# Patient Record
Sex: Female | Born: 1990 | Race: White | Hispanic: No | Marital: Married | Smoking: Never smoker
Health system: Southern US, Community
[De-identification: ages and names within clinical notes are randomized; demographics above are authoritative.]

## PROBLEM LIST (undated history)

## (undated) ENCOUNTER — Inpatient Hospital Stay (HOSPITAL_COMMUNITY): Payer: Self-pay

## (undated) DIAGNOSIS — Z9889 Other specified postprocedural states: Secondary | ICD-10-CM

## (undated) DIAGNOSIS — E119 Type 2 diabetes mellitus without complications: Secondary | ICD-10-CM

## (undated) DIAGNOSIS — R112 Nausea with vomiting, unspecified: Secondary | ICD-10-CM

## (undated) DIAGNOSIS — R7303 Prediabetes: Secondary | ICD-10-CM

## (undated) DIAGNOSIS — G5600 Carpal tunnel syndrome, unspecified upper limb: Secondary | ICD-10-CM

## (undated) DIAGNOSIS — O99350 Diseases of the nervous system complicating pregnancy, unspecified trimester: Secondary | ICD-10-CM

## (undated) DIAGNOSIS — K219 Gastro-esophageal reflux disease without esophagitis: Secondary | ICD-10-CM

## (undated) HISTORY — PX: WISDOM TOOTH EXTRACTION: SHX21

---
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
## 2010-02-23 ENCOUNTER — Inpatient Hospital Stay (HOSPITAL_COMMUNITY): Admission: RE | Admit: 2010-02-23 | Discharge: 2010-02-26 | Payer: Self-pay | Admitting: Obstetrics and Gynecology

## 2010-02-27 ENCOUNTER — Inpatient Hospital Stay (HOSPITAL_COMMUNITY): Admission: AD | Admit: 2010-02-27 | Discharge: 2010-02-28 | Payer: Self-pay | Admitting: Obstetrics and Gynecology

## 2010-08-06 ENCOUNTER — Inpatient Hospital Stay (HOSPITAL_COMMUNITY): Admission: EM | Admit: 2010-08-06 | Discharge: 2010-08-08 | Payer: Self-pay | Admitting: Emergency Medicine

## 2010-08-06 ENCOUNTER — Encounter: Payer: Self-pay | Admitting: Gastroenterology

## 2010-08-07 ENCOUNTER — Ambulatory Visit: Payer: Self-pay | Admitting: Gastroenterology

## 2010-08-07 ENCOUNTER — Encounter: Payer: Self-pay | Admitting: Internal Medicine

## 2011-01-22 NOTE — Procedures (Signed)
Summary: ERCP  Patient: Whitney Wilson Note: All result statuses are Final unless otherwise noted.  Tests: (1) ERCP (ERC)   ERC ERCP                  DONE     [facility]     [address] [place], [place]  [address]           ERCP PROCEDURE REPORT           PATIENT:  Whitney Wilson, Whitney Wilson  MR#:  [id number]     BIRTHDATE:  27-Jan-1991  GENDER:  female           ENDOSCOPIST:  Barbette Hair. Arlyce Dice, MD     ASSISTANT:           PROCEDURE DATE:  08/06/2010     PROCEDURE:  ERCP           INDICATIONS:  suspected stone           MEDICATIONS:   Fentanyl 75 mcg, Versed 7.5 mg IV, Benadryl 50 mg,     glycopyrrolate (Robinal) 0.2 mg     TOPICAL ANESTHETIC:  Cetacaine Spray           DESCRIPTION OF PROCEDURE:   After the risks benefits and     alternatives of the procedure were thoroughly explained, informed     consent was obtained.  The [id number] ([id number]) endoscope was     introduced through the mouth.  The pt was uncooperative and I was     unable to pass the scope into the esophagus despite multiple     attempts.  Procedure was abandoned.           COMPLICATIONS:  None           RECOMMENDATIONS:ERCP with MAC           ______________________________     Barbette Hair. Arlyce Dice, MD           CC:           n.     eSIGNED:   Barbette Hair. Kaplan at 08/06/2010 12:07 PM           Jonette Pesa, [id number]  Note: An exclamation mark (!) indicates a result that was not dispersed into the flowsheet. Document Creation Date: 08/06/2010 12:26 PM _______________________________________________________________________  (1) Order result status: Final Collection or observation date-time: 08/06/2010 12:04 Requested date-time:  Receipt date-time:  Reported date-time:  Referring Physician:   Ordering Physician: Melvia Heaps [phone]) Specimen Source:  Source: Launa Grill Order Number: [id number] Lab site:

## 2011-01-22 NOTE — Procedures (Signed)
Summary: ERCP  Patient: Whitney Wilson Note: All result statuses are Final unless otherwise noted.  Tests: (1) ERCP (ERC)   ERC ERCP                  DONE     [facility]     [address] [place], [place]  [address]           ERCP PROCEDURE REPORT           PATIENT:  Whitney, Wilson  MR#:  [id number]     BIRTHDATE:  Dec 25, 1990  GENDER:  female           ENDOSCOPIST:  Barbette Hair. Arlyce Dice, MD     ASSISTANT:           PROCEDURE DATE:  08/06/2010     PROCEDURE:  ERCP with removal of stones           INDICATIONS:  suspected stone           MEDICATIONS:   MAC sedation, administered by CRNA     TOPICAL ANESTHETIC:           DESCRIPTION OF PROCEDURE:   After the risks benefits and     alternatives of the procedure were thoroughly explained, informed     consent was obtained.  The  endoscope was introduced through the     mouth and advanced to the third portion of the duodenum.           The pancreatic duct was filled to the tail and appeared to be     normal. Care was taken not to overfill the ductal system.     Pancreatic duct cannulated with a 0.85mm wire. Partial injection     demonstrated a normal duct.  A stone was found in the common bile     duct. 6mm CBD stone demonstrated after direct cannulation of a     0.37mm wire followed by contrast injection. 12-29mm sphincterotomy     was made. Stone was extracted using a 15mm balloon stone     extractor.    The scope was then completely withdrawn from the     patient and the procedure terminated.           COMPLICATIONS:  None           ENDOSCOPIC IMPRESSION:     1) Normal pancreatic duct     2) Stone in the common bile duct - s/p sphincterotomy and stone     extraction     RECOMMENDATIONS:     1) cholecystectomy           ______________________________     Barbette Hair. Arlyce Dice, MD           CC:  Iva Boop, M.D., [facility]     Avel Peace, M.D.           n.     eSIGNED:   Barbette Hair. Kaplan at 08/06/2010  04:22 PM           Jonette Pesa, [phone]  Note: An exclamation mark (!) indicates a result that was not dispersed into the flowsheet. Document Creation Date: 08/06/2010 4:24 PM _______________________________________________________________________  (1) Order result status: Final Collection or observation date-time: 08/06/2010 16:17 Requested date-time:  Receipt date-time:  Reported date-time:  Referring Physician:   Ordering Physician: Melvia Heaps [phone]) Specimen Source:  Source: Launa Grill Order Number: [id number] Lab site:

## 2011-01-22 NOTE — Consult Note (Signed)
Summary: [place]  [place]   Imported By: Lester [place] 09/03/2010 11:02:15  _____________________________________________________________________  External Attachment:    Type:   Image     Comment:   External Document

## 2011-03-08 LAB — DIFFERENTIAL
Basophils Absolute: 0 10*3/uL (ref 0.0–0.1)
Basophils Relative: 0 % (ref 0–1)
Lymphocytes Relative: 15 % (ref 12–46)
Neutro Abs: 7.6 10*3/uL (ref 1.7–7.7)
Neutrophils Relative %: 78 % — ABNORMAL HIGH (ref 43–77)

## 2011-03-08 LAB — URINALYSIS, ROUTINE W REFLEX MICROSCOPIC
Nitrite: NEGATIVE
Specific Gravity, Urine: 1.033 — ABNORMAL HIGH (ref 1.005–1.030)
Urobilinogen, UA: 1 mg/dL (ref 0.0–1.0)
pH: 6 (ref 5.0–8.0)

## 2011-03-08 LAB — CBC
HCT: 37.9 % (ref 36.0–46.0)
MCH: 26.4 pg (ref 26.0–34.0)
MCV: 79.8 fL (ref 78.0–100.0)
Platelets: 214 10*3/uL (ref 150–400)
Platelets: 282 10*3/uL (ref 150–400)
RBC: 4.19 MIL/uL (ref 3.87–5.11)
RBC: 4.76 MIL/uL (ref 3.87–5.11)
RDW: 17.1 % — ABNORMAL HIGH (ref 11.5–15.5)
WBC: 4.5 10*3/uL (ref 4.0–10.5)

## 2011-03-08 LAB — COMPREHENSIVE METABOLIC PANEL
AST: 249 U/L — ABNORMAL HIGH (ref 0–37)
Albumin: 3.6 g/dL (ref 3.5–5.2)
Albumin: 4.2 g/dL (ref 3.5–5.2)
Alkaline Phosphatase: 107 U/L (ref 39–117)
Alkaline Phosphatase: 85 U/L (ref 39–117)
BUN: 10 mg/dL (ref 6–23)
BUN: 3 mg/dL — ABNORMAL LOW (ref 6–23)
CO2: 25 mEq/L (ref 19–32)
Chloride: 108 mEq/L (ref 96–112)
Chloride: 110 mEq/L (ref 96–112)
Creatinine, Ser: 0.7 mg/dL (ref 0.4–1.2)
GFR calc Af Amer: >60 mL/min (ref >60)
GFR calc non Af Amer: >60 mL/min (ref >60)
Glucose, Bld: 123 mg/dL — ABNORMAL HIGH (ref 70–99)
Potassium: 4.2 mEq/L (ref 3.5–5.1)
Total Bilirubin: 0.7 mg/dL (ref 0.3–1.2)
Total Bilirubin: 2.6 mg/dL — ABNORMAL HIGH (ref 0.3–1.2)
Total Protein: 6.1 g/dL (ref 6.0–8.3)

## 2011-03-08 LAB — AMYLASE: Amylase: 57 U/L (ref 0–105)

## 2011-03-08 LAB — LIPASE, BLOOD: Lipase: 37 U/L (ref 11–59)

## 2011-03-08 LAB — URINE MICROSCOPIC-ADD ON

## 2011-03-18 LAB — DIFFERENTIAL
Basophils Absolute: 0 10*3/uL (ref 0.0–0.1)
Basophils Absolute: 0 10*3/uL (ref 0.0–0.1)
Basophils Relative: 0 % (ref 0–1)
Lymphocytes Relative: 11 % — ABNORMAL LOW (ref 12–46)
Lymphocytes Relative: 18 % (ref 12–46)
Lymphs Abs: 1.8 10*3/uL (ref 0.7–4.0)
Monocytes Absolute: 0.6 10*3/uL (ref 0.1–1.0)
Monocytes Relative: 6 % (ref 3–12)
Neutro Abs: 10.9 10*3/uL — ABNORMAL HIGH (ref 1.7–7.7)
Neutro Abs: 7.7 10*3/uL (ref 1.7–7.7)
Neutrophils Relative %: 83 % — ABNORMAL HIGH (ref 43–77)

## 2011-03-18 LAB — CBC
HCT: 25.5 % — ABNORMAL LOW (ref 36.0–46.0)
HCT: 26.5 % — ABNORMAL LOW (ref 36.0–46.0)
HCT: 32.9 % — ABNORMAL LOW (ref 36.0–46.0)
Hemoglobin: 8.4 g/dL — ABNORMAL LOW (ref 12.0–15.0)
Hemoglobin: 8.7 g/dL — ABNORMAL LOW (ref 12.0–15.0)
MCHC: 32.4 g/dL (ref 30.0–36.0)
MCHC: 32.9 g/dL (ref 30.0–36.0)
MCHC: 33.1 g/dL (ref 30.0–36.0)
MCV: 78.8 fL (ref 78.0–100.0)
Platelets: 175 10*3/uL (ref 150–400)
Platelets: 240 10*3/uL (ref 150–400)
RBC: 3.4 MIL/uL — ABNORMAL LOW (ref 3.87–5.11)
RBC: 4.17 MIL/uL (ref 3.87–5.11)
RDW: 15.4 % (ref 11.5–15.5)
RDW: 15.6 % — ABNORMAL HIGH (ref 11.5–15.5)
RDW: 15.8 % — ABNORMAL HIGH (ref 11.5–15.5)
WBC: 10.3 10*3/uL (ref 4.0–10.5)
WBC: 8.4 10*3/uL (ref 4.0–10.5)

## 2011-03-18 LAB — RPR: RPR Ser Ql: NONREACTIVE

## 2011-03-29 LAB — URINALYSIS, ROUTINE W REFLEX MICROSCOPIC
Glucose, UA: NEGATIVE mg/dL
Nitrite: NEGATIVE
Specific Gravity, Urine: 1.025 (ref 1.005–1.030)
pH: 6 (ref 5.0–8.0)

## 2011-03-30 LAB — URINALYSIS, ROUTINE W REFLEX MICROSCOPIC
Hgb urine dipstick: NEGATIVE
Nitrite: NEGATIVE
Specific Gravity, Urine: 1.025 (ref 1.005–1.030)
Urobilinogen, UA: 0.2 mg/dL (ref 0.0–1.0)
pH: 6.5 (ref 5.0–8.0)

## 2011-03-30 LAB — CBC
Hemoglobin: 12.4 g/dL (ref 12.0–15.0)
MCV: 90.1 fL (ref 78.0–100.0)
RBC: 4.05 MIL/uL (ref 3.87–5.11)
WBC: 10.4 10*3/uL (ref 4.0–10.5)

## 2011-04-03 LAB — GC/CHLAMYDIA PROBE AMP, GENITAL
Chlamydia, DNA Probe: NEGATIVE
GC Probe Amp, Genital: NEGATIVE

## 2011-04-03 LAB — WET PREP, GENITAL
Clue Cells Wet Prep HPF POC: NONE SEEN
Trich, Wet Prep: NONE SEEN

## 2011-11-10 ENCOUNTER — Encounter: Payer: Self-pay | Admitting: *Deleted

## 2011-11-10 ENCOUNTER — Emergency Department (HOSPITAL_COMMUNITY): Payer: Medicaid Other

## 2011-11-10 ENCOUNTER — Inpatient Hospital Stay (HOSPITAL_COMMUNITY)
Admission: EM | Admit: 2011-11-10 | Discharge: 2011-11-14 | DRG: 603 | Disposition: A | Discharge status: Home / Self Care | Payer: Medicaid Other | Attending: Internal Medicine | Admitting: Internal Medicine

## 2011-11-10 DIAGNOSIS — Z79899 Other long term (current) drug therapy: Secondary | ICD-10-CM

## 2011-11-10 DIAGNOSIS — K5900 Constipation, unspecified: Secondary | ICD-10-CM | POA: Diagnosis not present

## 2011-11-10 DIAGNOSIS — K029 Dental caries, unspecified: Secondary | ICD-10-CM | POA: Diagnosis present

## 2011-11-10 DIAGNOSIS — L0201 Cutaneous abscess of face: Principal | ICD-10-CM | POA: Diagnosis present

## 2011-11-10 DIAGNOSIS — L03211 Cellulitis of face: Principal | ICD-10-CM | POA: Diagnosis present

## 2011-11-10 DIAGNOSIS — K0889 Other specified disorders of teeth and supporting structures: Secondary | ICD-10-CM

## 2011-11-10 LAB — CBC
HCT: 38.5 % (ref 36.0–46.0)
HCT: 37 % (ref 36.0–46.0)
Hemoglobin: 12.6 g/dL (ref 12.0–15.0)
MCH: 29.1 pg (ref 26.0–34.0)
MCV: 86.3 fL (ref 78.0–100.0)
MCV: 86.4 fL (ref 78.0–100.0)
RBC: 4.46 MIL/uL (ref 3.87–5.11)
RBC: 4.28 MIL/uL (ref 3.87–5.11)
RDW: 12.4 % (ref 11.5–15.5)
WBC: 10.5 10*3/uL (ref 4.0–10.5)
WBC: 10 10*3/uL (ref 4.0–10.5)

## 2011-11-10 LAB — CREATININE, SERUM: GFR calc Af Amer: >90 mL/min (ref >90)

## 2011-11-10 LAB — BASIC METABOLIC PANEL
BUN: 6 mg/dL (ref 6–23)
CO2: 25 mEq/L (ref 19–32)
Chloride: 102 mEq/L (ref 96–112)
GFR calc Af Amer: >90 mL/min (ref >90)
GFR calc non Af Amer: >90 mL/min (ref >90)

## 2011-11-10 MED ORDER — HEPARIN SODIUM (PORCINE) 5000 UNIT/ML IJ SOLN
5000.0000 [IU] | Freq: Three times a day (TID) | INTRAMUSCULAR | Status: DC
  Administered 2011-11-13: 5000 [IU] via SUBCUTANEOUS
  Filled 2011-11-10 (×14): qty 1

## 2011-11-10 MED ORDER — SODIUM CHLORIDE 0.9 % IV SOLN
INTRAVENOUS | Status: DC
  Administered 2011-11-10 – 2011-11-12 (×3): via INTRAVENOUS

## 2011-11-10 MED ORDER — NORETHINDRONE ACET-ETHINYL EST 1.5-30 MG-MCG PO TABS
1.0000 | ORAL_TABLET | Freq: Every day | ORAL | Status: DC
  Filled 2011-11-10: qty 1

## 2011-11-10 MED ORDER — HYDROCODONE-ACETAMINOPHEN 5-325 MG PO TABS
1.5000 | ORAL_TABLET | ORAL | Status: DC | PRN
  Administered 2011-11-10 – 2011-11-14 (×15): 1.5 via ORAL
  Filled 2011-11-10 (×15): qty 2

## 2011-11-10 MED ORDER — HYDROMORPHONE HCL PF 1 MG/ML IJ SOLN
1.0000 mg | Freq: Once | INTRAMUSCULAR | Status: AC
  Administered 2011-11-10: 1 mg via INTRAVENOUS
  Filled 2011-11-10: qty 1

## 2011-11-10 MED ORDER — CLINDAMYCIN PHOSPHATE 900 MG/50ML IV SOLN
900.0000 mg | Freq: Three times a day (TID) | INTRAVENOUS | Status: DC
  Administered 2011-11-10 – 2011-11-14 (×11): 900 mg via INTRAVENOUS
  Filled 2011-11-10 (×12): qty 50

## 2011-11-10 MED ORDER — ONDANSETRON HCL 4 MG/2ML IJ SOLN: 4.0000 mg | Freq: Three times a day (TID) | INTRAMUSCULAR | Status: DC | PRN

## 2011-11-10 MED ORDER — HYDROMORPHONE HCL PF 1 MG/ML IJ SOLN
1.0000 mg | INTRAMUSCULAR | Status: DC | PRN
  Administered 2011-11-10 – 2011-11-13 (×11): 1 mg via INTRAVENOUS
  Filled 2011-11-10 (×12): qty 1

## 2011-11-10 MED ORDER — CHLORHEXIDINE GLUCONATE 0.12 % MT SOLN
15.0000 mL | Freq: Four times a day (QID) | OROMUCOSAL | Status: DC
  Administered 2011-11-12 – 2011-11-13 (×6): 15 mL via OROMUCOSAL
  Filled 2011-11-10 (×17): qty 15

## 2011-11-10 MED ORDER — HYDROMORPHONE HCL PF 1 MG/ML IJ SOLN: 1.0000 mg | INTRAMUSCULAR | Status: DC | PRN

## 2011-11-10 MED ORDER — CLINDAMYCIN PHOSPHATE 900 MG/50ML IV SOLN
900.0000 mg | Freq: Three times a day (TID) | INTRAVENOUS | Status: DC
  Administered 2011-11-10: 900 mg via INTRAVENOUS
  Filled 2011-11-10 (×3): qty 50

## 2011-11-10 MED ORDER — ONDANSETRON HCL 4 MG/2ML IJ SOLN
4.0000 mg | Freq: Once | INTRAMUSCULAR | Status: AC
  Administered 2011-11-10: 4 mg via INTRAVENOUS
  Filled 2011-11-10: qty 2

## 2011-11-10 MED ORDER — IOHEXOL 300 MG/ML  SOLN
80.0000 mL | Freq: Once | INTRAMUSCULAR | Status: AC | PRN
  Administered 2011-11-10: 80 mL via INTRAVENOUS

## 2011-11-10 NOTE — ED Notes (Signed)
Attempted to call report to [phone], RN is passing meds and will call back for report

## 2011-11-10 NOTE — H&P (Addendum)
Whitney Wilson is an 20 y.o. female.   Chief Complaint:  fascial swelling and pain for 3 days HPI: This is 20 year old female without previous significant medical history, presented to the ED after has 5 teeth extraction on Thursday byDr. Barbette Merino.  Presented withr worsening pain and swelling yesterday and was started on by mouth clindamycin via her dentist. Today she reports worsening pain decreased intake and worsening swelling of the left side of her face. She's taken 3 oral clindamycin without improvement.presented to the ER for further evaluation. She denies fever or chills. She reports mild redness of the left side of her face. . Her symptoms are constant and worsening.   PMH. No pertinent past medical history.  Past Surgical History  Procedure Date  . Wisdom tooth extraction   . Cesarean section   cholecystectomy  Family history: no stroke, history of diabetes on her mom side Social History:  reports that she has never smoked.  She reports that she does not drink alcohol or use illicit drugs.she  Has son  Allergies:  Allergies  Allergen Reactions  . Morphine And Related Hives  . Unasyn (Ampicillin-Sulbactam Sodium) Hives    Medications Prior to Admission  Medication Dose Route Frequency Provider Last Rate Last Dose  . clindamycin (CLEOCIN) IVPB 900 mg  900 mg Intravenous Q8H Lyanne Co, MD   900 mg at 11/10/11 1440  . HYDROmorphone (DILAUDID) injection 1 mg  1 mg Intravenous Once Lyanne Co, MD   1 mg at 11/10/11 1537  . iohexol (OMNIPAQUE) 300 MG/ML injection 80 mL  80 mL Intravenous Once PRN Lyanne Co, MD   80 mL at 11/10/11 1542  . ondansetron (ZOFRAN) injection 4 mg  4 mg Intravenous Once Lyanne Co, MD   4 mg at 11/10/11 1538   No current outpatient prescriptions on file as of 11/10/2011.    Results for orders placed during the hospital encounter of 11/10/11 (from the past 48 hour(s))  CBC     Status: Normal   Collection Time   11/10/11  1:57 PM     Component Value Range Comment   WBC 10.5  4.0 - 10.5 (K/uL)    RBC 4.46  3.87 - 5.11 (MIL/uL)    Hemoglobin 13.0  12.0 - 15.0 (g/dL)    HCT 47.8  29.5 - 62.1 (%)    MCV 86.3  78.0 - 100.0 (fL)    MCH 29.1  26.0 - 34.0 (pg)    MCHC 33.8  30.0 - 36.0 (g/dL)    RDW 30.8  65.7 - 84.6 (%)    Platelets 234  150 - 400 (K/uL)   BASIC METABOLIC PANEL     Status: Normal   Collection Time   11/10/11  1:57 PM      Component Value Range Comment   Sodium 138  135 - 145 (mEq/L)    Potassium 3.6  3.5 - 5.1 (mEq/L)    Chloride 102  96 - 112 (mEq/L)    CO2 25  19 - 32 (mEq/L)    Glucose, Bld 99  70 - 99 (mg/dL)    BUN 6  6 - 23 (mg/dL)    Creatinine, Ser 9.62  0.50 - 1.10 (mg/dL)    Calcium 9.6  8.4 - 10.5 (mg/dL)    GFR calc non Af Amer >90  >90 (mL/min)    GFR calc Af Amer >90  >90 (mL/min)    Ct Maxillofacial W/cm  11/10/2011  *RADIOLOGY  REPORT*  Clinical Data: Left facial pain and swelling.  Wisdom tooth removal several days ago.  CT MAXILLOFACIAL WITH CONTRAST  Technique:  Multidetector CT imaging of the maxillofacial structures was performed with intravenous contrast. Multiplanar CT image reconstructions were also generated.  Contrast: 80mL OMNIPAQUE IOHEXOL 300 MG/ML IV SOLN  Comparison: None.  Findings: Extraction beds from the bilateral maxillary and mandibular lateral molars noted, with gas in the mandibular and left maxillary sockets and with mucoperiosteal thickening inferiorly in the left maxillary sinus which is probably from chronic sinusitis.  There cavities of both of the middle maxillary molars, larger on the left than the right  Asymmetric enlargement the left masseter muscle is present, with submasseteric phlegmon and phlegmon along the lateral margin of the left mandible on images 61-72 of series 3.  This phlegmon is deep to the platysma, but there is also subcutaneous edema, left greater than right, favoring cellulitis or secondary inflammatory findings.  Small submandibular lymph  nodes are present along with mild bilateral station II adenopathy which is probably reactive. Inflammatory stranding extends down into the subcutaneous tissues of the anterior neck as shown on image 90 of series 3.  A drainable abscess is not currently observed.  A small amount of gas is present in the soft tissues adjacent to the right mastoid air muscle, possibly within small venous structures.  IMPRESSION:  1.  Abnormal phlegmon extending from the left submasseteric region into the space between the mandible and left platysma.  No drainable abscess is currently observed, but there is overlying subcutaneous edema which could reflect cellulitis.  There is also some low-level stranding in the right facial tissues, and the inflammatory stranding extends down into the anterior neck and along the superficial margin of the strap musculature.  Reactive adenopathy node along the internal jugular and submandibular chains. 2.  Dental cavities and the middle maxillary molars bilaterally. 3.  Mild chronic left sinusitis.  Original Report Authenticated By: Dellia Cloud, M.D.    Review of Systems  Constitutional: Positive for fever and chills. Negative for weight loss, malaise/fatigue and diaphoresis.  HENT: Positive for ear pain and neck pain. Negative for hearing loss and tinnitus.        Complains of face swelling and pain, redness around the left cheek   Eyes: Negative for pain.  Respiratory: Negative for cough, hemoptysis and sputum production.   Cardiovascular: Negative for chest pain, palpitations, orthopnea and leg swelling.  Gastrointestinal: Negative for nausea, vomiting, abdominal pain and diarrhea.  Genitourinary: Negative for dysuria, urgency and frequency.  Musculoskeletal: Positive for myalgias. Negative for back pain, joint pain and falls.  Skin: Negative for itching and rash.  Neurological: Negative for dizziness, tingling, tremors, weakness and headaches.    Blood pressure 103/70,  pulse 74, temperature 98.3 F (36.8 C), temperature source Oral, resp. rate 16, height 5\' 4"  (1.626 m), weight 81.194 kg (179 lb), SpO2 96.00%. Physical Exam  Constitutional: She appears well-developed and well-nourished. No distress.  HENT:  Head: Normocephalic and atraumatic.  Right Ear: External ear normal.  Left Ear: External ear normal.  Nose: Nose normal.       Swelling of the left face and cheek ,unable to fully open her mouth . Cannot assess for any inside cavity , eyes within normal  Eyes: Pupils are equal, round, and reactive to light. Right eye exhibits no discharge. Left eye exhibits no discharge.  Neck: No JVD present. No tracheal deviation present.  Cardiovascular: Normal rate, regular rhythm and intact  distal pulses.  Exam reveals no friction rub.   No murmur heard. Respiratory: No respiratory distress. She has no wheezes. She has rales (some mild redness at tthe neck with no swelling).  GI: She exhibits no distension and no mass. There is no tenderness. There is no rebound and no guarding.  Musculoskeletal: She exhibits no edema and no tenderness.  Lymphadenopathy:    She has no cervical adenopathy.  Neurological: She is alert. She has normal reflexes. She displays normal reflexes. No cranial nerve deficit. She exhibits normal muscle tone. Coordination normal.  Skin: She is not diaphoretic.     Assessment/Plan  this is 20 year old female presented with worsening fascial swelling after 5 teeth extraction, CT did not suggest abscess only cellulitis or focal inflammation after procedure  will admit to observe with iv clindamycin, chlorhexidene mouth wash, pain control and IV FLUID , patient dentist will evaluate CT scan and evaluate the patient in am , patient is stable to be admitted to regular floor , pregnancy test Leslea Vowles I. 11/10/2011, 6:12 PM

## 2011-11-10 NOTE — ED Notes (Signed)
Consult at bedside.

## 2011-11-10 NOTE — ED Notes (Signed)
C/o increased pain and nausea. edp aware.

## 2011-11-10 NOTE — ED Notes (Signed)
Pt had wisdom teeth removed on Thur.  Pt has had increase and swelling to the left side of her face.  No resp distress noted.  Pt states that she has had fever with same.

## 2011-11-10 NOTE — ED Notes (Signed)
Assumed care of patient, report from [facility], patient coming from stretcher triage.  Introduced self to patient and friend at bedside.  Resting on ed stretcher, vitals stable, no complaints, denies pain, awaiting CT report, call bell within reach, will continue to monitor.

## 2011-11-10 NOTE — ED Provider Notes (Signed)
History     CSN: [id number] Arrival date & time: 11/10/2011  1:43 PM   First MD Initiated Contact with Patient 11/10/11 1347      Chief Complaint  Patient presents with  . Jaw Pain  . Facial Swelling    (Consider location/radiation/quality/duration/timing/severity/associated sxs/prior treatment) HPI Patient reports wisdom teeth extraction 3 days ago.  This was completed by Dr. Barbette Merino.  Report or worsening pain and swelling yesterday and was started on by mouth clindamycin via her dentist.  Today she reports worsening pain decreased intake and worsening swelling of the left side of her face.  She's taken 3 oral clindamycin without improvement.  Is present the ER for further evaluation.  She denies fever or chills.  She reports no redness of the left side of her face.  Nothing worsens her symptoms improves her symptoms.  Her symptoms are constant and worsening. History reviewed. No pertinent past medical history.  Past Surgical History  Procedure Date  . Wisdom tooth extraction   . Cesarean section     History reviewed. No pertinent family history.  History  Substance Use Topics  . Smoking status: Never Smoker   . Smokeless tobacco: Not on file  . Alcohol Use: No    OB History    Grav Para Term Preterm Abortions TAB SAB Ect Mult Living                  Review of Systems  All other systems reviewed and are negative.    Allergies  Morphine and related and Unasyn  Home Medications   Current Outpatient Rx  Name Route Sig Dispense Refill  . CLINDAMYCIN HCL 150 MG PO CAPS Oral Take 300 mg by mouth 3 (three) times daily. Pt started antibiotic 11/17. For 7 days.     Marland Kitchen HYDROCODONE-ACETAMINOPHEN 5-325 MG PO TABS Oral Take 1.5 tablets by mouth every 4 (four) hours as needed. For pain.     . IBUPROFEN 200 MG PO TABS Oral Take 400 mg by mouth every 6 (six) hours as needed. For pain.     Marland Kitchen LOESTRIN 1.5/30 (21) PO Oral Take 1 tablet by mouth daily.        BP 103/70  Pulse  74  Temp(Src) 98.3 F (36.8 C) (Oral)  Resp 16  Ht 5\' 4"  (1.626 m)  Wt 179 lb (81.194 kg)  BMI 30.73 kg/m2  SpO2 96%  Physical Exam  Nursing note and vitals reviewed. Constitutional: She is oriented to person, place, and time. She appears well-developed and well-nourished. No distress.  HENT:  Head: Normocephalic and atraumatic.       Packing in place in bilateral lower wisdom tooth sockets.  Unable to visualize upper third molars given significant trismus.  The patient is tolerating her secretions.  Her airway is patent.  Or so of lingular space is soft and not swollen.  She has no gingival swelling.  She has significant facial swelling on the left side of her face with lymphadenopathy noted on her left as well as erythema with warmth of her left mandible and under left chin.  There is no extension to her chest.  Eyes: EOM are normal.  Neck: Normal range of motion.  Cardiovascular: Normal rate, regular rhythm and normal heart sounds.   Pulmonary/Chest: Effort normal and breath sounds normal.  Abdominal: Soft. She exhibits no distension. There is no tenderness.  Musculoskeletal: Normal range of motion.  Neurological: She is alert and oriented to person, place, and time.  Skin:  Skin is warm and dry.  Psychiatric: She has a normal mood and affect. Judgment normal.    ED Course  Procedures (including critical care time)   Labs Reviewed  CBC  BASIC METABOLIC PANEL   Ct Maxillofacial W/cm  11/10/2011  *RADIOLOGY REPORT*  Clinical Data: Left facial pain and swelling.  Wisdom tooth removal several days ago.  CT MAXILLOFACIAL WITH CONTRAST  Technique:  Multidetector CT imaging of the maxillofacial structures was performed with intravenous contrast. Multiplanar CT image reconstructions were also generated.  Contrast: 80mL OMNIPAQUE IOHEXOL 300 MG/ML IV SOLN  Comparison: None.  Findings: Extraction beds from the bilateral maxillary and mandibular lateral molars noted, with gas in the  mandibular and left maxillary sockets and with mucoperiosteal thickening inferiorly in the left maxillary sinus which is probably from chronic sinusitis.  There cavities of both of the middle maxillary molars, larger on the left than the right  Asymmetric enlargement the left masseter muscle is present, with submasseteric phlegmon and phlegmon along the lateral margin of the left mandible on images 61-72 of series 3.  This phlegmon is deep to the platysma, but there is also subcutaneous edema, left greater than right, favoring cellulitis or secondary inflammatory findings.  Small submandibular lymph nodes are present along with mild bilateral station II adenopathy which is probably reactive. Inflammatory stranding extends down into the subcutaneous tissues of the anterior neck as shown on image 90 of series 3.  A drainable abscess is not currently observed.  A small amount of gas is present in the soft tissues adjacent to the right mastoid air muscle, possibly within small venous structures.  IMPRESSION:  1.  Abnormal phlegmon extending from the left submasseteric region into the space between the mandible and left platysma.  No drainable abscess is currently observed, but there is overlying subcutaneous edema which could reflect cellulitis.  There is also some low-level stranding in the right facial tissues, and the inflammatory stranding extends down into the anterior neck and along the superficial margin of the strap musculature.  Reactive adenopathy node along the internal jugular and submandibular chains. 2.  Dental cavities and the middle maxillary molars bilaterally. 3.  Mild chronic left sinusitis.  Original Report Authenticated By: Dellia Cloud, M.D.   I personally reviewed his CT scan of the face  1. Facial cellulitis   2. Pain, dental       MDM  IV clindamycin given that the patient has an allergy to Unasyn.  CT maxillofacial demonstrates phlegmon without drainable abscess.  I discussed  the case with her dentist Dr. Barbette Merino who agreed with admission for IV antibiotics.  He requests admission to medical service and Unasyn the patient first thing in the morning.  He will personally evaluate the patient in the morning but he will review her CT scan this evening  Discuss case with the triad hospitalist who will admit.  Team [address]        Lyanne Co, MD 11/10/11 [phone]

## 2011-11-10 NOTE — ED Notes (Signed)
Again attempted to call report, RN Thomasene Lot not available and will call back for report.

## 2011-11-11 LAB — CBC
Hemoglobin: 12.2 g/dL (ref 12.0–15.0)
RBC: 4.14 MIL/uL (ref 3.87–5.11)
WBC: 9.7 10*3/uL (ref 4.0–10.5)

## 2011-11-11 MED ORDER — SODIUM CHLORIDE 0.9 % IR SOLN
200.0000 mL | Freq: Every day | Status: DC
  Administered 2011-11-11 – 2011-11-14 (×15): 200 mL

## 2011-11-11 NOTE — Consult Note (Signed)
Reason for Consult: Left submandibular swelling Referring Physician:  Karen, Huhta is an 20 y.o. female who complains of left facial swelling and pain postoperatively.   HPI: Whitney Wilson underwent removal of impacted third molars on 11/07/2011 with IV sedation in my office. The surgery was uneventful and Amarah tolerated the procedure well. Post-operatively, she contacted me several times over the weekend complaining of increasing swelling and pain. Clindamycin was prescribed 11/09/2011 300mg  TID. The patient reported to the hospital ER 11/11/2011 with no improvement and was admitted.   History reviewed. No pertinent past medical history.  Past Surgical History  Procedure Date  . Wisdom tooth extraction   . Cesarean section     History reviewed. No pertinent family history.  Social History:  reports that she has never smoked. She does not have any smokeless tobacco history on file. She reports that she does not drink alcohol or use illicit drugs.  Allergies:  Allergies  Allergen Reactions  . Morphine And Related Hives  . Unasyn (Ampicillin-Sulbactam Sodium) Hives    Medications: I have reviewed the patient's current medications.  Results for orders placed during the hospital encounter of 11/10/11 (from the past 48 hour(s))  CBC     Status: Normal   Collection Time   11/10/11  1:57 PM      Component Value Range Comment   WBC 10.5  4.0 - 10.5 (K/uL)    RBC 4.46  3.87 - 5.11 (MIL/uL)    Hemoglobin 13.0  12.0 - 15.0 (g/dL)    HCT 16.1  09.6 - 04.5 (%)    MCV 86.3  78.0 - 100.0 (fL)    MCH 29.1  26.0 - 34.0 (pg)    MCHC 33.8  30.0 - 36.0 (g/dL)    RDW 40.9  81.1 - 91.4 (%)    Platelets 234  150 - 400 (K/uL)   BASIC METABOLIC PANEL     Status: Normal   Collection Time   11/10/11  1:57 PM      Component Value Range Comment   Sodium 138  135 - 145 (mEq/L)    Potassium 3.6  3.5 - 5.1 (mEq/L)    Chloride 102  96 - 112 (mEq/L)    CO2 25  19 - 32 (mEq/L)    Glucose, Bld 99  70 - 99 (mg/dL)    BUN 6  6 - 23 (mg/dL)    Creatinine, Ser 7.82  0.50 - 1.10 (mg/dL)    Calcium 9.6  8.4 - 10.5 (mg/dL)    GFR calc non Af Amer >90  >90 (mL/min)    GFR calc Af Amer >90  >90 (mL/min)   CBC     Status: Normal   Collection Time   11/10/11  8:47 PM      Component Value Range Comment   WBC 10.0  4.0 - 10.5 (K/uL)    RBC 4.28  3.87 - 5.11 (MIL/uL)    Hemoglobin 12.6  12.0 - 15.0 (g/dL)    HCT 95.6  21.3 - 08.6 (%)    MCV 86.4  78.0 - 100.0 (fL)    MCH 29.4  26.0 - 34.0 (pg)    MCHC 34.1  30.0 - 36.0 (g/dL)    RDW 57.8  46.9 - 62.9 (%)    Platelets 226  150 - 400 (K/uL)   CREATININE, SERUM     Status: Normal   Collection Time   11/10/11  8:47 PM      Component Value  Range Comment   Creatinine, Ser 0.60  0.50 - 1.10 (mg/dL)    GFR calc non Af Amer >90  >90 (mL/min)    GFR calc Af Amer >90  >90 (mL/min)   CBC     Status: Normal   Collection Time   11/11/11  6:00 AM      Component Value Range Comment   WBC 9.7  4.0 - 10.5 (K/uL)    RBC 4.14  3.87 - 5.11 (MIL/uL)    Hemoglobin 12.2  12.0 - 15.0 (g/dL)    HCT 16.1  09.6 - 04.5 (%)    MCV 87.0  78.0 - 100.0 (fL)    MCH 29.5  26.0 - 34.0 (pg)    MCHC 33.9  30.0 - 36.0 (g/dL)    RDW 40.9  81.1 - 91.4 (%)    Platelets 217  150 - 400 (K/uL)     Ct Maxillofacial W/cm  11/10/2011  *RADIOLOGY REPORT*  Clinical Data: Left facial pain and swelling.  Wisdom tooth removal several days ago.  CT MAXILLOFACIAL WITH CONTRAST  Technique:  Multidetector CT imaging of the maxillofacial structures was performed with intravenous contrast. Multiplanar CT image reconstructions were also generated.  Contrast: 80mL OMNIPAQUE IOHEXOL 300 MG/ML IV SOLN  Comparison: None.  Findings: Extraction beds from the bilateral maxillary and mandibular lateral molars noted, with gas in the mandibular and left maxillary sockets and with mucoperiosteal thickening inferiorly in the left maxillary sinus which is probably from chronic  sinusitis.  There cavities of both of the middle maxillary molars, larger on the left than the right  Asymmetric enlargement the left masseter muscle is present, with submasseteric phlegmon and phlegmon along the lateral margin of the left mandible on images 61-72 of series 3.  This phlegmon is deep to the platysma, but there is also subcutaneous edema, left greater than right, favoring cellulitis or secondary inflammatory findings.  Small submandibular lymph nodes are present along with mild bilateral station II adenopathy which is probably reactive. Inflammatory stranding extends down into the subcutaneous tissues of the anterior neck as shown on image 90 of series 3.  A drainable abscess is not currently observed.  A small amount of gas is present in the soft tissues adjacent to the right mastoid air muscle, possibly within small venous structures.  IMPRESSION:  1.  Abnormal phlegmon extending from the left submasseteric region into the space between the mandible and left platysma.  No drainable abscess is currently observed, but there is overlying subcutaneous edema which could reflect cellulitis.  There is also some low-level stranding in the right facial tissues, and the inflammatory stranding extends down into the anterior neck and along the superficial margin of the strap musculature.  Reactive adenopathy node along the internal jugular and submandibular chains. 2.  Dental cavities and the middle maxillary molars bilaterally. 3.  Mild chronic left sinusitis.  Original Report Authenticated By: Dellia Cloud, M.D.     Blood pressure 104/70, pulse 94, temperature 98.1 F (36.7 C), temperature source Oral, resp. rate 16, height 5' 4.8" (1.646 m), weight 80.74 kg (178 lb), SpO2 91.00%.  PE: Resting comfortably in NAD HEENT: PERRLA, EOMI, Septum pink, midline Oral/Facial- Moderate non-fluctuant edema L mandible and submandibular area. Maximum oral opening approx 20mm. Pharynx clear. No dysphonia,  dysphagia. Neck- non-tender Cor-RRR w/o murmur Lungs-Clear Abd- Soft, nontender  Assessment/Plan:20 YO WF with Left Submandibular Cellulitis vs Abcess. Continue IV antibiotics. Continue to monitor for possible I & D. Consider repeat CT if  not improved to R/O  abcess formation.  Charnele Semple M 11/11/2011, 7:33 AM

## 2011-11-11 NOTE — Progress Notes (Signed)
Subjective: Still complaining of significant left mandibular swelling, but but some improvement compared with yesterday. Has no fever but is still with pain, she cannot chew.  Objective: Vital signs in last 24 hours: Filed Vitals:   11/10/11 1849 11/10/11 2017 11/11/11 0518 11/11/11 1433  BP: 108/76 116/74 104/70 111/74  Pulse: 67 81 94 84  Temp:  97.5 F (36.4 C) 98.1 F (36.7 C) 98.3 F (36.8 C)  TempSrc:  Oral Oral Oral  Resp: 16 16 16 18   Height:  5' 4.8" (1.646 m)    Weight:  80.74 kg (178 lb)    SpO2: 97% 99% 91% 95%   Weight change:   Intake/Output Summary (Last 24 hours) at 11/11/11 1446 Last data filed at 11/11/11 1434  Gross per 24 hour  Intake 1651.25 ml  Output      0 ml  Net 1651.25 ml   On exam Patient sitting on bed not on respiratory distress, pupils equally reactive to light and accommodation Significantly swelling on the left mandibular area with some 3 multiply by 4cm  Lump. Very tender to palpation, there is no spreading of the swelling to the neck, and neck mobility within normal Heart S1 and S2 no added sounds lungs examination normal vesicular breathing equal air entry Abdomen soft nontender bowel sounds present   extremities without edema, no cyanosis pulses intact CNS patient fully oriented and awake, no neurological deficit   Lab Results:  Basename 11/10/11 2047 11/10/11 1357  NA -- 138  K -- 3.6  CL -- 102  CO2 -- 25  GLUCOSE -- 99  BUN -- 6  CREATININE 0.60 0.58  CALCIUM -- 9.6  MG -- --  PHOS -- --   No results found for this basename: AST:2,ALT:2,ALKPHOS:2,BILITOT:2,PROT:2,ALBUMIN:2 in the last 72 hours No results found for this basename: LIPASE:2,AMYLASE:2 in the last 72 hours  Basename 11/11/11 0600 11/10/11 2047  WBC 9.7 10.0  NEUTROABS -- --  HGB 12.2 12.6  HCT 36.0 37.0  MCV 87.0 86.4  PLT 217 226   No results found for this or any previous visit (from the past 240 hour(s)).  Studies/Results: Ct Maxillofacial  W/cm  11/10/2011  *RADIOLOGY REPORT*  Clinical Data: Left facial pain and swelling.  Wisdom tooth removal several days ago.  CT MAXILLOFACIAL WITH CONTRAST  Technique:  Multidetector CT imaging of the maxillofacial structures was performed with intravenous contrast. Multiplanar CT image reconstructions were also generated.  Contrast: 80mL OMNIPAQUE IOHEXOL 300 MG/ML IV SOLN  Comparison: None.  Findings: Extraction beds from the bilateral maxillary and mandibular lateral molars noted, with gas in the mandibular and left maxillary sockets and with mucoperiosteal thickening inferiorly in the left maxillary sinus which is probably from chronic sinusitis.  There cavities of both of the middle maxillary molars, larger on the left than the right  Asymmetric enlargement the left masseter muscle is present, with submasseteric phlegmon and phlegmon along the lateral margin of the left mandible on images 61-72 of series 3.  This phlegmon is deep to the platysma, but there is also subcutaneous edema, left greater than right, favoring cellulitis or secondary inflammatory findings.  Small submandibular lymph nodes are present along with mild bilateral station II adenopathy which is probably reactive. Inflammatory stranding extends down into the subcutaneous tissues of the anterior neck as shown on image 90 of series 3.  A drainable abscess is not currently observed.  A small amount of gas is present in the soft tissues adjacent to the right mastoid  air muscle, possibly within small venous structures.  IMPRESSION:  1.  Abnormal phlegmon extending from the left submasseteric region into the space between the mandible and left platysma.  No drainable abscess is currently observed, but there is overlying subcutaneous edema which could reflect cellulitis.  There is also some low-level stranding in the right facial tissues, and the inflammatory stranding extends down into the anterior neck and along the superficial margin of the  strap musculature.  Reactive adenopathy node along the internal jugular and submandibular chains. 2.  Dental cavities and the middle maxillary molars bilaterally. 3.  Mild chronic left sinusitis.  Original Report Authenticated By: Dellia Cloud, M.D.    Medications: I have reviewed the patient's current medications. Scheduled Meds:   . chlorhexidine  15 mL Mouth/Throat QID  . clindamycin (CLEOCIN) IV  900 mg Intravenous Q8H  . heparin  5,000 Units Subcutaneous Q8H  .  HYDROmorphone (DILAUDID) injection  1 mg Intravenous Once  . Norethindrone Acetate-Ethinyl Estradiol  1 tablet Oral Daily  . ondansetron (ZOFRAN) IV  4 mg Intravenous Once  . DISCONTD: clindamycin (CLEOCIN) IV  900 mg Intravenous Q8H   Continuous Infusions:   . sodium chloride 75 mL/hr at 11/11/11 1028   PRN Meds:.HYDROcodone-acetaminophen, HYDROmorphone (DILAUDID) injection, iohexol, ondansetron (ZOFRAN) IV, DISCONTD:  HYDROmorphone (DILAUDID) injection  Assessment/Plan: 1-left submandibular cellulitis after tooth extraction, we'll continue with IV clindamycin, normal saline mouthwash, will continue monitoring the patient hopefully the swelling will come down if within the next 48 hour no improvement would consider repeat CT scan to exclude formation of abscess, patient  informed   LOS: 1 day  Javon Snee I. 11/11/2011, 2:46 PM

## 2011-11-11 NOTE — Progress Notes (Signed)
Utilization review complete 

## 2011-11-12 ENCOUNTER — Inpatient Hospital Stay (HOSPITAL_COMMUNITY): Payer: Medicaid Other

## 2011-11-12 DIAGNOSIS — K029 Dental caries, unspecified: Secondary | ICD-10-CM | POA: Diagnosis present

## 2011-11-12 LAB — PREGNANCY, URINE: Preg Test, Ur: NEGATIVE

## 2011-11-12 MED ORDER — ENSURE PUDDING PO PUDG
1.0000 | Freq: Three times a day (TID) | ORAL | Status: DC
  Administered 2011-11-12 – 2011-11-13 (×6): 1 via ORAL

## 2011-11-12 MED ORDER — IOHEXOL 300 MG/ML  SOLN
75.0000 mL | Freq: Once | INTRAMUSCULAR | Status: AC | PRN
  Administered 2011-11-12: 75 mL via INTRAVENOUS

## 2011-11-12 NOTE — Progress Notes (Signed)
Subjective: Patient doing okay, she states that she's a little bit bored. He does complain of continued swelling on the left side of her face plus some tenderness. It is easier to swallow. No other complaints.  Objective: Weight change:   Intake/Output Summary (Last 24 hours) at 11/12/11 1345 Last data filed at 11/12/11 0900  Gross per 24 hour  Intake 2021.25 ml  Output      0 ml  Net 2021.25 ml   BP 108/71  Pulse 77  Temp(Src) 98.4 F (36.9 C) (Oral)  Resp 20  Ht 5' 4.8" (1.646 m)  Wt 80.74 kg (178 lb)  BMI 29.80 kg/m2  SpO2 97%  Lab Results: Basic Metabolic Panel:  Basename 11/10/11 2047 11/10/11 1357  NA -- 138  K -- 3.6  CL -- 102  CO2 -- 25  GLUCOSE -- 99  BUN -- 6  CREATININE 0.60 0.58  CALCIUM -- 9.6  MG -- --  PHOS -- --  CBC:  Basename 11/11/11 0600 11/10/11 2047  WBC 9.7 10.0  NEUTROABS -- --  HGB 12.2 12.6  HCT 36.0 37.0  MCV 87.0 86.4  PLT 217 226   Studies/Results: Ct Maxillofacial W/cm  11/10/2011    IMPRESSION:  1.  Abnormal phlegmon extending from the left submasseteric region into the space between the mandible and left platysma.  No drainable abscess is currently observed, but there is overlying subcutaneous edema which could reflect cellulitis.  There is also some low-level stranding in the right facial tissues, and the inflammatory stranding extends down into the anterior neck and along the superficial margin of the strap musculature.  Reactive adenopathy node along the internal jugular and submandibular chains. 2.  Dental cavities and the middle maxillary molars bilaterally. 3.  Mild chronic left sinusitis.   Medications: Scheduled Meds:   . chlorhexidine  15 mL Mouth/Throat QID  . clindamycin (CLEOCIN) IV  900 mg Intravenous Q8H  . feeding supplement  1 Container Oral TID BM  . heparin  5,000 Units Subcutaneous Q8H  . Norethindrone Acetate-Ethinyl Estradiol  1 tablet Oral Daily  . sodium chloride irrigation  200 mL Irrigation 6 X Daily    Continuous Infusions:   . sodium chloride 75 mL/hr at 11/12/11 0024   PRN Meds:.HYDROcodone-acetaminophen, HYDROmorphone (DILAUDID) injection, ondansetron (ZOFRAN) IV  Assessment/Plan: Patient Active Hospital Problem List: Cellulitis diffuse, face (11/10/2011)  continue IV antibiotics. Check followup CT to rule out abscess which will be after 48 hours from previous study.  Dental caries (11/12/2011)  dental following.   LOS: 2 days   Windell Musson K 11/12/2011, 1:45 PM

## 2011-11-12 NOTE — Progress Notes (Signed)
Whitney Wilson is a 20 y.o. female patient with:  1. Facial cellulitis   2. Pain, dental     History reviewed. No pertinent past medical history.  Current Facility-Administered Medications  Medication Dose Route Frequency Provider Last Rate Last Dose  . 0.9 %  sodium chloride infusion   Intravenous Continuous Hind I. Elsaid 75 mL/hr at 11/12/11 0024    . chlorhexidine (PERIDEX) 0.12 % solution 15 mL  15 mL Mouth/Throat QID Hind I. Elsaid      . clindamycin (CLEOCIN) IVPB 900 mg  900 mg Intravenous Q8H Hind I. Elsaid   900 mg at 11/12/11 1610  . heparin injection 5,000 Units  5,000 Units Subcutaneous Q8H Hind I. Elsaid      . HYDROcodone-acetaminophen (NORCO) 5-325 MG per tablet 1.5 tablet  1.5 tablet Oral Q4H PRN Hind I. Elsaid   1.5 tablet at 11/12/11 0314  . HYDROmorphone (DILAUDID) injection 1 mg  1 mg Intravenous Q4H PRN Hind I. Elsaid   1 mg at 11/12/11 9604  . Norethindrone Acetate-Ethinyl Estradiol (JUNEL,LOESTRIN,MICROGESTIN) 1.5-30 MG-MCG tablet 1 tablet  1 tablet Oral Daily Hind I. Elsaid      . ondansetron (ZOFRAN) injection 4 mg  4 mg Intravenous Q8H PRN Hind I. Elsaid      . sodium chloride irrigation 0.9 % 200 mL  200 mL Irrigation 6 X Daily Hind I. Elsaid   200 mL at 11/12/11 5409   Allergies  Allergen Reactions  . Morphine And Related Hives  . Unasyn (Ampicillin-Sulbactam Sodium) Hives   Active Problems:  Cellulitis diffuse, face  Vitals: Blood pressure 108/71, pulse 77, temperature 98.4 F (36.9 C), temperature source Oral, resp. rate 20, height 5' 4.8" (1.646 m), weight 80.74 kg (178 lb), SpO2 97.00%. Lab results: Results for orders placed during the hospital encounter of 11/10/11 (from the past 24 hour(s))  PREGNANCY, URINE     Status: Normal   Collection Time   11/12/11  3:15 AM      Component Value Range   Preg Test, Ur NEGATIVE      S: Patient feels somewhat improved. Less pain and swelling. Wishes to advance diet to puree. O VSS Afebrile  HEENT  PERRLA EOMI  Oral/Facial: Left Submandibular swelling persists. Non-fluctuant, some softening compared with 11/11/2011. Cutaneous erythema persists. Opens to 25mm, improved compared to 11/11/2011. Pharynx clear. Neck non-tender A- Slowly improving Left Submandibular Cellulitis. P- Continue IV antibiotics. Advance diet to puree.   [name] 11/12/2011

## 2011-11-13 MED ORDER — OXYCODONE HCL 5 MG PO TABS: 5.0000 mg | ORAL_TABLET | ORAL | Status: DC | PRN

## 2011-11-13 MED ORDER — KETOROLAC TROMETHAMINE 10 MG PO TABS
10.0000 mg | ORAL_TABLET | Freq: Three times a day (TID) | ORAL | Status: DC | PRN
  Administered 2011-11-13 – 2011-11-14 (×2): 10 mg via ORAL
  Filled 2011-11-13 (×3): qty 1

## 2011-11-13 NOTE — Progress Notes (Signed)
Whitney Wilson is a 20 y.o. female patient with  1. Facial cellulitis   2. Pain, dental    S- Feels somewhat better. Taking po's well. O-VSS Afebrile. History reviewed. No pertinent past medical history.  Current Facility-Administered Medications  Medication Dose Route Frequency Provider Last Rate Last Dose  . 0.9 %  sodium chloride infusion   Intravenous Continuous Hind I. Elsaid 75 mL/hr at 11/12/11 0700    . chlorhexidine (PERIDEX) 0.12 % solution 15 mL  15 mL Mouth/Throat QID Hind I. Elsaid   15 mL at 11/12/11 1813  . clindamycin (CLEOCIN) IVPB 900 mg  900 mg Intravenous Q8H Hind I. Elsaid   900 mg at 11/13/11 0514  . feeding supplement (ENSURE) pudding 1 Container  1 Container Oral TID BM Rosemarie Galvis M [place] Sigmon   1 Container at 11/12/11 1813  . heparin injection 5,000 Units  5,000 Units Subcutaneous Q8H Hind I. Elsaid   5,000 Units at 11/13/11 0514  . HYDROcodone-acetaminophen (NORCO) 5-325 MG per tablet 1.5 tablet  1.5 tablet Oral Q4H PRN Hind I. Elsaid   1.5 tablet at 11/13/11 0042  . HYDROmorphone (DILAUDID) injection 1 mg  1 mg Intravenous Q4H PRN Hind I. Elsaid   1 mg at 11/13/11 0411  . iohexol (OMNIPAQUE) 300 MG/ML injection 75 mL  75 mL Intravenous Once PRN Medication Radiologist   75 mL at 11/12/11 1614  . Norethindrone Acetate-Ethinyl Estradiol (JUNEL,LOESTRIN,MICROGESTIN) 1.5-30 MG-MCG tablet 1 tablet  1 tablet Oral Daily Hind I. Elsaid      . ondansetron (ZOFRAN) injection 4 mg  4 mg Intravenous Q8H PRN Hind I. Elsaid      . sodium chloride irrigation 0.9 % 200 mL  200 mL Irrigation 6 X Daily Hind I. Elsaid   200 mL at 11/13/11 0517   Allergies  Allergen Reactions  . Morphine And Related Hives  . Unasyn (Ampicillin-Sulbactam Sodium) Hives   Active Problems:  Cellulitis diffuse, face  Dental caries  Vitals: Blood pressure 100/62, pulse 61, temperature 98.3 F (36.8 C), temperature source Oral, resp. rate 19, height 5' 4.8" (1.646 m), weight 80.74 kg (178 lb), SpO2  97.00%. Lab results:No results found for this or any previous visit (from the past 24 hour(s)). Radiology Results: Ct Maxillofacial W/cm  11/12/2011  *RADIOLOGY REPORT*  Clinical Data: Facial cellulitis.  Persistence pain and swelling.  CT MAXILLOFACIAL WITH CONTRAST  Technique:  Multidetector CT imaging of the maxillofacial structures was performed with intravenous contrast. Multiplanar CT image reconstructions were also generated.  Contrast: 75mL OMNIPAQUE IOHEXOL 300 MG/ML IV SOLN  Comparison: 11/10/2011  Findings: The subcutaneous edema of the face around the mandible and extending into the left cheek has diminished particularly on the left.  There is still soft tissue stranding with fluid extending from superficial to the mandible mandible superficial to the platysma and around the left submandibular gland and anterior to the sternocleidomastoid muscles, left greater than right. However, all of this has slightly diminished.  The edema in the left masseter muscle has diminished.   There is a better defined area of lucency along the superficial aspect of the left side of the  body of the mandible at approximately the level of teeth number 18 and 19. These teeth appear normal including their roots.  This area of lucency measures 16 x 7 x 7 mm and is felt to represent a focal area of infection although I doubt that it represents drainable pus.  The patient has bilateral adenopathy in the neck which is  essentially unchanged.  Parotid glands are normal.  Prevertebral soft tissues are normal.  Again noted is air in the sockets of the extracted wisdom teeth. Dental caries are noted and unchanged.  No acute disease of the paranasal sinuses or mastoid air cells or middle ear cavities.  IMPRESSION:    There has been improvement in the facial cellulitis as well as of the inflammation in the left masseter muscle.  There is a more defined area of lucency superficial to the left mandibular body representing a residual  area of inflammation but I doubt that this represents drainable pus.  Original Report Authenticated By: Gwynn Burly, M.D.   Physical Exam: HEENT PERRLA EOMI    Oral pharynx clear, maximum opening approximately 25 mm. No oral drainage or fluctuance.   Facial- Edema persists, slightly decreased from 11/12/2011. Soft peripherally with firm indurated area more centrally approximately 3 cm by 3 cm.   Cor-RRR s murmur   Lungs-CTA A- Slowly improving left facial cellulitis. No drainable abcess. P- Continue IV Antibiotics. Possibly consider D/C am with oral clindamycin if patient continues to improve.  [name] 11/13/2011

## 2011-11-13 NOTE — Progress Notes (Signed)
UR review completed. 

## 2011-11-13 NOTE — Progress Notes (Signed)
Subjective: Patient feeling better. She continues to still have some left facial pain, swelling and numbness although this is improved from previous except for the numbness.  Objective: Weight change:   Intake/Output Summary (Last 24 hours) at 11/13/11 1840 Last data filed at 11/12/11 1900  Gross per 24 hour  Intake    900 ml  Output      0 ml  Net    900 ml   BP 113/71  Pulse 78  Temp(Src) 98.8 F (37.1 C) (Oral)  Resp 18  Ht 5' 4.8" (1.646 m)  Wt 80.74 kg (178 lb)  BMI 29.80 kg/m2  SpO2 97% General: Alert and oriented x3, no apparent distress HEENT: Patient has swelling but no erythema of the left side of her face/cheek with some focal tenderness and induration over the site of the left mandible. She subjectively reports some numbness at this area. Cardiovascular regular rate and rhythm S1-S2 Lungs auscultation bilaterally Abdomen: Soft, nontender, nondistended, positive bowel sounds Extrusion or clubbing cyanosis or edema. Lab Results: Basic Metabolic Panel:  Basename 11/10/11 2047  NA --  K --  CL --  CO2 --  GLUCOSE --  BUN --  CREATININE 0.60  CALCIUM --  MG --  PHOS --  CBC:  Basename 11/11/11 0600 11/10/11 2047  WBC 9.7 10.0  NEUTROABS -- --  HGB 12.2 12.6  HCT 36.0 37.0  MCV 87.0 86.4  PLT 217 226   Studies/Results: Ct Maxillofacial W/cm-followup  11/12/2011    IMPRESSION: There has been improvement in the facial cellulitis as well as of  the inflammation in the left masseter muscle. There is a more  defined area of lucency superficial to the left mandibular body  representing a residual area of inflammation but I doubt that this  represents drainable pus.   Medications: Scheduled Meds:    . chlorhexidine  15 mL Mouth/Throat QID  . clindamycin (CLEOCIN) IV  900 mg Intravenous Q8H  . feeding supplement  1 Container Oral TID BM  . heparin  5,000 Units Subcutaneous Q8H  . Norethindrone Acetate-Ethinyl Estradiol  1 tablet Oral Daily  .  sodium chloride irrigation  200 mL Irrigation 6 X Daily   Continuous Infusions:    . DISCONTD: sodium chloride 75 mL/hr at 11/12/11 0700   PRN Meds:.HYDROcodone-acetaminophen, ketorolac, ondansetron (ZOFRAN) IV, oxyCODONE, DISCONTD:  HYDROmorphone (DILAUDID) injection  Assessment/Plan: Patient Active Hospital Problem List: Cellulitis diffuse, face (11/10/2011)  continue IV antibiotics. Patient continues to improve. No signs of any abscess. Followup CT confirms improvement antibiotics. At this rate like we will be able to discharge the patient home tomorrow with by mouth antibiotics and pain medications, and given these prescriptions the patient's mom so that she may be also his prescriptions tonight since then opened tomorrow. Patient will followup with dentistry as an outpatient  Dental caries (11/12/2011)  dental following.   LOS: 3 days   [name] 11/13/2011, 6:40 PM

## 2011-11-14 DIAGNOSIS — K5900 Constipation, unspecified: Secondary | ICD-10-CM | POA: Diagnosis not present

## 2011-11-14 MED ORDER — OXYCODONE HCL 5 MG PO TABS: 5.0000 mg | ORAL_TABLET | ORAL | Status: AC | PRN

## 2011-11-14 MED ORDER — CLINDAMYCIN HCL 300 MG PO CAPS: 300.0000 mg | ORAL_CAPSULE | Freq: Three times a day (TID) | ORAL | Status: AC

## 2011-11-14 NOTE — Discharge Summary (Signed)
DISCHARGE SUMMARY  Whitney Wilson  MR#: [id number]  DOB:02-04-1991  Date of Admission: 11/10/2011 Date of Discharge: 11/14/2011  Attending Physician:Keyarra Rendall K  Patient's PCP:No primary provider on file.  Consults:Treatment Team:  [name], dentistry  Discharge Diagnoses: Present on Admission:  .Cellulitis diffuse, face .Dental caries    Current Discharge Medication List    START taking these medications   Details  oxyCODONE (OXY IR/ROXICODONE) 5 MG immediate release tablet Take 1 tablet (5 mg total) by mouth every 4 (four) hours as needed. Qty: 30 tablet      CONTINUE these medications which have CHANGED   Details  clindamycin (CLEOCIN) 300 MG capsule Take 1 capsule (300 mg total) by mouth 3 (three) times daily. Pt started antibiotic 11/17. For 7 days.      CONTINUE these medications which have NOT CHANGED   Details  HYDROcodone-acetaminophen (NORCO) 5-325 MG per tablet Take 1.5 tablets by mouth every 4 (four) hours as needed. For pain.     ibuprofen (ADVIL,MOTRIN) 200 MG tablet Take 400 mg by mouth every 6 (six) hours as needed. For pain.     Norethindrone Acet-Ethinyl Est (LOESTRIN 1.5/30, 21, PO) Take 1 tablet by mouth daily.            Hospital Course: Present on Admission:  .Cellulitis diffuse, face .Dental caries: Patient is a 20 year old white female with no past medical history who underwent multiple tooth extraction 3 days prior to hospital admission. Following the procedure if over the next several days, she had worsening pain and swelling and was started on oral clindamycin. Because of her pain and swelling she's not been able to properly chew or take n.p.o. Then she started having some mild redness as well as persistent pain and swelling to the point where she came to the emergency room for further evaluation.  CT scan maxillofacial done in the emergency room noted no drainable abscess but overlying subcutaneous edema reflecting  cellulitis of the right facial tissues as well as some abnormal phlegmon extending from the left side massenteric region. Cavities are noted as well.  The patient was started on IV clindamycin plus medication for pain and inflammation. She continued to improve slowly. There was some persistent induration, but a repeat CT done on 1120 confirmed no evidence of abscess and improving cellulitis.  By hospital day 5, her swelling has significantly improved and she was able to take some by mouth. Sure pain was able to be controlled on by mouth medications at this point. The plan will be for her to be discharged home on oral antibiotics for another 5 days, for total 10 days. She'll followup with Dr. Barbette Merino of dentistry of 4 more days.  Constipation: Patient does not have bowel movement for about one week. Likely this is in part to poor by mouth intake plus constipation brought on by narcotic medications. I am advising over-the-counter laxative. Day of Discharge BP 115/75  Pulse 90  Temp(Src) 98.1 F (36.7 C) (Oral)  Resp 14  Ht 5' 4.8" (1.646 m)  Wt 80.74 kg (178 lb)  BMI 29.80 kg/m2  SpO2 96%  Physical Exam: General: Alert and oriented x3, no apparent distress  HEENT: Patient has swelling that has significantly decreased,  no erythema of the left side of her face/cheek with some only residual focal tenderness and induration over the site of the left mandible. She subjectively reports some numbness at this area.  Cardiovascular regular rate and rhythm S1-S2  Lungs auscultation bilaterally  Abdomen: Soft,  nontender, nondistended, positive bowel sounds  Extrusion or clubbing cyanosis or edema.  Disposition: Improved, being discharged home   Follow-up Appts: Discharge Orders    Future Orders Please Complete By Expires   Diet general      Increase activity slowly         Follow-up with Dr. Barbette Merino, dentistry in 4 days.  SignedHollice Espy 11/14/2011, 10:50 AM

## 2011-11-14 NOTE — Progress Notes (Signed)
Gave pt AVS with d/c instructions, follow up appointments, and medication administration list. MD gave new prescription to pt. D/C IV, unremarkable. All questions answered. MD to give pt work note . To be D/C home with husband. Peter Congo RN

## 2011-11-14 NOTE — Progress Notes (Signed)
Whitney Wilson is a 20 y.o. female patient.  1. Facial cellulitis   2. Pain, dental    S-Patient feels somewhat better. Less swelling. O- VSS Afebrile  Current Facility-Administered Medications  Medication Dose Route Frequency Provider Last Rate Last Dose  . chlorhexidine (PERIDEX) 0.12 % solution 15 mL  15 mL Mouth/Throat QID Hind I. Elsaid   15 mL at 11/13/11 1814  . clindamycin (CLEOCIN) IVPB 900 mg  900 mg Intravenous Q8H Hind I. Elsaid   900 mg at 11/14/11 0502  . feeding supplement (ENSURE) pudding 1 Container  1 Container Oral TID BM Adrienne Trombetta M Kaleia Longhi   1 Container at 11/13/11 1815  . heparin injection 5,000 Units  5,000 Units Subcutaneous Q8H Hind I. Elsaid   5,000 Units at 11/13/11 0514  . HYDROcodone-acetaminophen (NORCO) 5-325 MG per tablet 1.5 tablet  1.5 tablet Oral Q4H PRN Hind I. Elsaid   1.5 tablet at 11/14/11 0209  . ketorolac (TORADOL) tablet 10 mg  10 mg Oral Q8H PRN [name], MD   10 mg at 11/14/11 0511  . Norethindrone Acetate-Ethinyl Estradiol (JUNEL,LOESTRIN,MICROGESTIN) 1.5-30 MG-MCG tablet 1 tablet  1 tablet Oral Daily Hind I. Elsaid      . ondansetron (ZOFRAN) injection 4 mg  4 mg Intravenous Q8H PRN Hind I. Elsaid      . oxyCODONE (Oxy IR/ROXICODONE) immediate release tablet 5 mg  5 mg Oral Q4H PRN [name], MD      . sodium chloride irrigation 0.9 % 200 mL  200 mL Irrigation 6 X Daily Hind I. Elsaid   200 mL at 11/14/11 0507  . DISCONTD: 0.9 %  sodium chloride infusion   Intravenous Continuous Hind I. Elsaid 75 mL/hr at 11/12/11 0700    . DISCONTD: HYDROmorphone (DILAUDID) injection 1 mg  1 mg Intravenous Q4H PRN Hind I. Elsaid   1 mg at 11/13/11 0411   Allergies  Allergen Reactions  . Morphine And Related Hives  . Unasyn (Ampicillin-Sulbactam Sodium) Hives   Active Problems:  Cellulitis diffuse, face  Dental caries  Vitals: Blood pressure 115/75, pulse 90, temperature 98.1 F (36.7 C), temperature source Oral, resp. rate 14, height 5'  4.8" (1.646 m), weight 80.74 kg (178 lb), SpO2 96.00%. Lab results:No results found for this or any previous visit (from the past 24 hour(s)). Radiology Results: No results found for this or any previous visit (from the past 24 hour(s)). Physical Exam: Alert & oriented x 3 No acute distress HEENT: Left Mandible swelling. No cutaneous erythema. Non-fluctuant. Oral- Maximum opening approximately 25 mm. Pharynx clear. No fluctuance or purulence. Neck- Non-tender Heart-RRR s murmur Lungs- CTA  Assessment: Patient continues to improve on IV antibiotics.  Plan: OK for D/C home from my perspective. Continue PO Cleocin, pain meds, oral rinse. Will follow up in my office in 4 days.  [name] 11/14/2011

## 2012-02-20 ENCOUNTER — Encounter (HOSPITAL_BASED_OUTPATIENT_CLINIC_OR_DEPARTMENT_OTHER): Payer: Self-pay

## 2012-02-20 ENCOUNTER — Emergency Department (INDEPENDENT_AMBULATORY_CARE_PROVIDER_SITE_OTHER): Payer: Self-pay

## 2012-02-20 ENCOUNTER — Emergency Department (HOSPITAL_BASED_OUTPATIENT_CLINIC_OR_DEPARTMENT_OTHER)
Admission: EM | Admit: 2012-02-20 | Discharge: 2012-02-20 | Disposition: A | Discharge status: Home Health | Payer: Self-pay | Attending: Emergency Medicine | Admitting: Emergency Medicine

## 2012-02-20 DIAGNOSIS — R6883 Chills (without fever): Secondary | ICD-10-CM | POA: Insufficient documentation

## 2012-02-20 DIAGNOSIS — N949 Unspecified condition associated with female genital organs and menstrual cycle: Secondary | ICD-10-CM

## 2012-02-20 DIAGNOSIS — R109 Unspecified abdominal pain: Secondary | ICD-10-CM | POA: Insufficient documentation

## 2012-02-20 DIAGNOSIS — N83209 Unspecified ovarian cyst, unspecified side: Secondary | ICD-10-CM | POA: Insufficient documentation

## 2012-02-20 DIAGNOSIS — R112 Nausea with vomiting, unspecified: Secondary | ICD-10-CM | POA: Insufficient documentation

## 2012-02-20 DIAGNOSIS — N83519 Torsion of ovary and ovarian pedicle, unspecified side: Secondary | ICD-10-CM

## 2012-02-20 DIAGNOSIS — M549 Dorsalgia, unspecified: Secondary | ICD-10-CM | POA: Insufficient documentation

## 2012-02-20 DIAGNOSIS — N8353 Torsion of ovary, ovarian pedicle and fallopian tube: Secondary | ICD-10-CM | POA: Insufficient documentation

## 2012-02-20 LAB — URINALYSIS, ROUTINE W REFLEX MICROSCOPIC
Glucose, UA: NEGATIVE mg/dL
Leukocytes, UA: NEGATIVE
Protein, ur: NEGATIVE mg/dL
Specific Gravity, Urine: 1.035 — ABNORMAL HIGH (ref 1.005–1.030)
pH: 6 (ref 5.0–8.0)

## 2012-02-20 LAB — WET PREP, GENITAL
Clue Cells Wet Prep HPF POC: NONE SEEN
Trich, Wet Prep: NONE SEEN
Yeast Wet Prep HPF POC: NONE SEEN

## 2012-02-20 MED ORDER — HYDROCODONE-ACETAMINOPHEN 5-500 MG PO TABS: 1.0000 | ORAL_TABLET | Freq: Four times a day (QID) | ORAL | Status: AC | PRN

## 2012-02-20 MED ORDER — KETOROLAC TROMETHAMINE 60 MG/2ML IM SOLN
60.0000 mg | Freq: Once | INTRAMUSCULAR | Status: AC
  Administered 2012-02-20: 60 mg via INTRAMUSCULAR
  Filled 2012-02-20: qty 2

## 2012-02-20 NOTE — ED Provider Notes (Addendum)
History     CSN: [id number]  Arrival date & time 02/20/12  0806   First MD Initiated Contact with Patient 02/20/12 [phone]      Chief Complaint  Patient presents with  . Abdominal Pain    (Consider location/radiation/quality/duration/timing/severity/associated sxs/prior treatment) Patient is a 21 y.o. female presenting with abdominal pain. The history is provided by the patient.  Abdominal Pain The primary symptoms of the illness include abdominal pain, nausea and vomiting. The primary symptoms of the illness do not include fever, diarrhea, vaginal discharge or vaginal bleeding. The current episode started 2 days ago. The onset of the illness was gradual. The problem has been rapidly worsening.  The abdominal pain is located in the suprapubic region. The abdominal pain radiates to the back. The severity of the abdominal pain is 5/10. The abdominal pain is relieved by nothing.  The patient states that she believes she is currently not pregnant. The patient has not had a change in bowel habit. Additional symptoms associated with the illness include chills and anorexia. Symptoms associated with the illness do not include constipation, urgency, hematuria or frequency.    History reviewed. No pertinent past medical history.  Past Surgical History  Procedure Date  . Wisdom tooth extraction   . Cesarean section   . Cholecystectomy     No family history on file.  History  Substance Use Topics  . Smoking status: Never Smoker   . Smokeless tobacco: Not on file  . Alcohol Use: No    OB History    Grav Para Term Preterm Abortions TAB SAB Ect Mult Living                  Review of Systems  Constitutional: Positive for chills and appetite change. Negative for fever.  Gastrointestinal: Positive for nausea, vomiting, abdominal pain and anorexia. Negative for diarrhea and constipation.  Genitourinary: Negative for urgency, frequency, hematuria, vaginal bleeding, vaginal discharge and  vaginal pain.  All other systems reviewed and are negative.    Allergies  Morphine and related and Unasyn  Home Medications   Current Outpatient Rx  Name Route Sig Dispense Refill  . HYDROCODONE-ACETAMINOPHEN 5-325 MG PO TABS Oral Take 1.5 tablets by mouth every 4 (four) hours as needed. For pain.     . IBUPROFEN 200 MG PO TABS Oral Take 400 mg by mouth every 6 (six) hours as needed. For pain.     Marland Kitchen LOESTRIN 1.5/30 (21) PO Oral Take 1 tablet by mouth daily.        BP 149/100  Pulse 99  Temp(Src) 98.1 F (36.7 C) (Oral)  Resp 16  Ht 5\' 4"  (1.626 m)  Wt 180 lb (81.647 kg)  BMI 30.90 kg/m2  SpO2 98%  LMP 02/14/2012  Physical Exam  Nursing note and vitals reviewed. Constitutional: She is oriented to person, place, and time. She appears well-developed and well-nourished. No distress.  HENT:  Head: Normocephalic and atraumatic.  Mouth/Throat: Oropharynx is clear and moist.  Eyes: Conjunctivae and EOM are normal. Pupils are equal, round, and reactive to light.  Neck: Normal range of motion. Neck supple.  Cardiovascular: Normal rate, regular rhythm and intact distal pulses.   No murmur heard. Pulmonary/Chest: Effort normal and breath sounds normal. No respiratory distress. She has no wheezes. She has no rales.  Abdominal: Soft. Normal appearance. She exhibits no distension. There is tenderness. There is no rebound, no guarding and no CVA tenderness.    Genitourinary: Vagina normal and uterus normal. Cervix  exhibits no motion tenderness, no discharge and no friability. Right adnexum displays no mass and no tenderness. Left adnexum displays tenderness. Left adnexum displays no mass.  Musculoskeletal: Normal range of motion. She exhibits no edema and no tenderness.  Neurological: She is alert and oriented to person, place, and time.  Skin: Skin is warm and dry. No rash noted. No erythema.  Psychiatric: She has a normal mood and affect. Her behavior is normal.    ED Course    Procedures (including critical care time)  Labs Reviewed  URINALYSIS, ROUTINE W REFLEX MICROSCOPIC - Abnormal; Notable for the following:    APPearance CLOUDY (*)    Specific Gravity, Urine 1.035 (*)    All other components within normal limits  PREGNANCY, URINE  WET PREP, GENITAL  GC/CHLAMYDIA PROBE AMP, GENITAL   US Transvaginal Non-ob  02/20/2012  *RADIOLOGY REPORT*  Clinical Data: Left ovarian cyst, torsion; pain  TRANSVAGINAL ULTRASOUND OF PELVIS  Technique:  Transvaginal ultrasound examination of the pelvis was performed including evaluation of the uterus, ovaries, adnexal regions, and pelvic cul-de-sac.  Comparison:  None.  Findings:  The uterus is normal in size and echotexture, measuring 5.8 x 3.6 x 4.6 cm.  The endometrial stripe is thin and homogeneous, measuring 8 mm in width. A small amount of fluid is present within the endometrial cavity  which is a nonspecific finding.  Both ovaries have a normal size and appearance.  The right ovary measures 3.1 x 1.9 x 2.6 cm, and the left ovary measures 2.3 x 1.8 x 2.2 cm.  There is symmetric arterial and venous flow to both ovaries.  There are no adnexal masses or free pelvic fluid.  IMPRESSION: Normal study. No evidence of pelvic mass or other significant abnormality.  Original Report Authenticated By: Brandon Melnick, M.D.   [place] Art/ven Flow Abd Pelv Doppler  02/20/2012  *RADIOLOGY REPORT*  Clinical Data: Left ovarian cyst, torsion; pain  TRANSVAGINAL ULTRASOUND OF PELVIS  Technique:  Transvaginal ultrasound examination of the pelvis was performed including evaluation of the uterus, ovaries, adnexal regions, and pelvic cul-de-sac.  Comparison:  None.  Findings:  The uterus is normal in size and echotexture, measuring 5.8 x 3.6 x 4.6 cm.  The endometrial stripe is thin and homogeneous, measuring 8 mm in width. A small amount of fluid is present within the endometrial cavity  which is a nonspecific finding.  Both ovaries have a normal size and  appearance.  The right ovary measures 3.1 x 1.9 x 2.6 cm, and the left ovary measures 2.3 x 1.8 x 2.2 cm.  There is symmetric arterial and venous flow to both ovaries.  There are no adnexal masses or free pelvic fluid.  IMPRESSION: Normal study. No evidence of pelvic mass or other significant abnormality.  Original Report Authenticated By: Brandon Melnick, M.D.     1. Torsed ovary   2. Ovarian cyst       MDM   Patient with left lower pelvic pain that started several days ago and got worse last night with nausea and vomiting. She denies any dysuria, vaginal bleeding or discharge. Normal menstrual period about 2 weeks ago. No fever or diarrhea noted. Feel patient's symptoms are most likely from a ruptured ovarian cyst. Symptoms are not consistent with appendicitis, diverticulitis, pancreatitis or renal colic. UA, UPT, wet prep and GC chlamydia pending.  Based on pelvic exam will get ultrasound to rule out left ruptured ovarian cyst.  11:58 AM Ultrasound negative for torsion or free  fluid. Given patient's symptoms still feel most likely a ruptured ovarian cyst. After a Toradol injection she is feeling much better. She will followup with her OB/GYN in one week.      Gwyneth Sprout, MD 02/20/12 1159  Coarse ovary is showing up in diagnoses however there is no sign of a torsed ovary. Unable to delete it from medical records.  Gwyneth Sprout, MD 02/20/12 1202

## 2012-02-20 NOTE — Discharge Instructions (Signed)
Ovarian Cyst The ovaries are small organs that are on each side of the uterus. The ovaries are the organs that produce the female hormones, estrogen and progesterone. An ovarian cyst is a sac filled with fluid that can vary in its size. It is normal for a small cyst to form in women who are in the childbearing age and who have menstrual periods. This type of cyst is called a follicle cyst that becomes an ovulation cyst (corpus luteum cyst) after it produces the women's egg. It later goes away on its own if the woman does not become pregnant. There are other kinds of ovarian cysts that may cause problems and may need to be treated. The most serious problem is a cyst with cancer. It should be noted that menopausal women who have an ovarian cyst are at a higher risk of it being a cancer cyst. They should be evaluated very quickly, thoroughly and followed closely. This is especially true in menopausal women because of the high rate of ovarian cancer in women in menopause. CAUSES AND TYPES OF OVARIAN CYSTS:  FUNCTIONAL CYST: The follicle/corpus luteum cyst is a functional cyst that occurs every month during ovulation with the menstrual cycle. They go away with the next menstrual cycle if the woman does not get pregnant. Usually, there are no symptoms with a functional cyst.   ENDOMETRIOMA CYST: This cyst develops from the lining of the uterus tissue. This cyst gets in or on the ovary. It grows every month from the bleeding during the menstrual period. It is also called a "chocolate cyst" because it becomes filled with blood that turns brown. This cyst can cause pain in the lower abdomen during intercourse and with your menstrual period.   CYSTADENOMA CYST: This cyst develops from the cells on the outside of the ovary. They usually are not cancerous. They can get very big and cause lower abdomen pain and pain with intercourse. This type of cyst can twist on itself, cut off its blood supply and cause severe pain.  It also can easily rupture and cause a lot of pain.   DERMOID CYST: This type of cyst is sometimes found in both ovaries. They are found to have different kinds of body tissue in the cyst. The tissue includes skin, teeth, hair, and/or cartilage. They usually do not have symptoms unless they get very big. Dermoid cysts are rarely cancerous.   POLYCYSTIC OVARY: This is a rare condition with hormone problems that produces many small cysts on both ovaries. The cysts are follicle-like cysts that never produce an egg and become a corpus luteum. It can cause an increase in body weight, infertility, acne, increase in body and facial hair and lack of menstrual periods or rare menstrual periods. Many women with this problem develop type 2 diabetes. The exact cause of this problem is unknown. A polycystic ovary is rarely cancerous.   THECA LUTEIN CYST: Occurs when too much hormone (human chorionic gonadotropin) is produced and over-stimulates the ovaries to produce an egg. They are frequently seen when doctors stimulate the ovaries for invitro-fertilization (test tube babies).   LUTEOMA CYST: This cyst is seen during pregnancy. Rarely it can cause an obstruction to the birth canal during labor and delivery. They usually go away after delivery.  SYMPTOMS   Pelvic pain or pressure.   Pain during sexual intercourse.   Increasing girth (swelling) of the abdomen.   Abnormal menstrual periods.   Increasing pain with menstrual periods.   You stop having   menstrual periods and you are not pregnant.  DIAGNOSIS  The diagnosis can be made during:  Routine or annual pelvic examination (common).   Ultrasound.   X-ray of the pelvis.   CT Scan.   MRI.   Blood tests.  TREATMENT   Treatment may only be to follow the cyst monthly for 2 to 3 months with your caregiver. Many go away on their own, especially functional cysts.   May be aspirated (drained) with a long needle with ultrasound, or by laparoscopy  (inserting a tube into the pelvis through a small incision).   The whole cyst can be removed by laparoscopy.   Sometimes the cyst may need to be removed through an incision in the lower abdomen.   Hormone treatment is sometimes used to help dissolve certain cysts.   Birth control pills are sometimes used to help dissolve certain cysts.  HOME CARE INSTRUCTIONS  Follow your caregiver's advice regarding:  Medicine.   Follow up visits to evaluate and treat the cyst.   You may need to come back or make an appointment with another caregiver, to find the exact cause of your cyst, if your caregiver is not a gynecologist.   Get your yearly and recommended pelvic examinations and Pap tests.   Let your caregiver know if you have had an ovarian cyst in the past.  SEEK MEDICAL CARE IF:   Your periods are late, irregular, they stop, or are painful.   Your stomach (abdomen) or pelvic pain does not go away.   Your stomach becomes larger or swollen.   You have pressure on your bladder or trouble emptying your bladder completely.   You have painful sexual intercourse.   You have feelings of fullness, pressure, or discomfort in your stomach.   You lose weight for no apparent reason.   You feel generally ill.   You become constipated.   You lose your appetite.   You develop acne.   You have an increase in body and facial hair.   You are gaining weight, without changing your exercise and eating habits.   You think you are pregnant.  SEEK IMMEDIATE MEDICAL CARE IF:   You have increasing abdominal pain.   You feel sick to your stomach (nausea) and/or vomit.   You develop a fever that comes on suddenly.   You develop abdominal pain during a bowel movement.   Your menstrual periods become heavier than usual.  Document Released: 12/09/2005 Document Revised: 08/21/2011 Document Reviewed: 10/12/2009 [employer] Patient Information 2012 [employer] 

## 2012-02-20 NOTE — ED Notes (Signed)
Pt c/o Lower L abdominal pain radiating to R side and lower back.  Pt denies urinary SX.  Pt states she has felt nauseated.  Pain is at surgical site for c section 2 years ago.

## 2012-07-20 LAB — OB RESULTS CONSOLE RPR: RPR: NONREACTIVE

## 2012-07-20 LAB — OB RESULTS CONSOLE HEPATITIS B SURFACE ANTIGEN: Hepatitis B Surface Ag: NEGATIVE

## 2012-07-20 LAB — OB RESULTS CONSOLE HIV ANTIBODY (ROUTINE TESTING): HIV: NONREACTIVE

## 2012-07-20 LAB — OB RESULTS CONSOLE RUBELLA ANTIBODY, IGM: Rubella: IMMUNE

## 2012-12-12 ENCOUNTER — Encounter (HOSPITAL_COMMUNITY): Payer: Self-pay | Admitting: *Deleted

## 2012-12-12 ENCOUNTER — Inpatient Hospital Stay (HOSPITAL_COMMUNITY)
Admission: AD | Admit: 2012-12-12 | Discharge: 2012-12-12 | Disposition: A | Discharge status: Home / Self Care | Payer: Medicaid Other | Source: Ambulatory Visit | Attending: Obstetrics and Gynecology | Admitting: Obstetrics and Gynecology

## 2012-12-12 DIAGNOSIS — M545 Low back pain: Secondary | ICD-10-CM

## 2012-12-12 DIAGNOSIS — O99891 Other specified diseases and conditions complicating pregnancy: Secondary | ICD-10-CM | POA: Insufficient documentation

## 2012-12-12 DIAGNOSIS — R109 Unspecified abdominal pain: Secondary | ICD-10-CM | POA: Insufficient documentation

## 2012-12-12 DIAGNOSIS — M549 Dorsalgia, unspecified: Secondary | ICD-10-CM | POA: Insufficient documentation

## 2012-12-12 LAB — URINALYSIS, ROUTINE W REFLEX MICROSCOPIC
Bilirubin Urine: NEGATIVE
Glucose, UA: NEGATIVE mg/dL
Protein, ur: 30 mg/dL — AB

## 2012-12-12 LAB — WET PREP, GENITAL
Trich, Wet Prep: NONE SEEN
Yeast Wet Prep HPF POC: NONE SEEN

## 2012-12-12 LAB — URINE MICROSCOPIC-ADD ON

## 2012-12-12 MED ORDER — CYCLOBENZAPRINE HCL 10 MG PO TABS
10.0000 mg | ORAL_TABLET | Freq: Once | ORAL | Status: AC
  Administered 2012-12-12: 10 mg via ORAL
  Filled 2012-12-12: qty 1

## 2012-12-12 NOTE — MAU Note (Signed)
I've had pressure in my lower back the entire pregnancy but it's worse today. Also having abd. Cramping which is new

## 2012-12-12 NOTE — MAU Provider Note (Signed)
History     CSN: [id number]  Arrival date and time: 12/12/12 3086   First Provider Initiated Contact with Patient 12/12/12 0328      Chief Complaint  Patient presents with  . Abdominal Cramping  . Back Pain   HPI  HAIVYN ORAVEC is a 21 y.o. G2P1001 who presents today with lower abdominal cramping and back pressure. She states that the pain started around 2230 tonight. It has not gotten worse, but it has not gotten better she called the RN line at her OB's office and they recommended she come in. She denies any LOF or VB. +FM. Her last pregnancy went full term.   History reviewed. No pertinent past medical history.  Past Surgical History  Procedure Date  . Wisdom tooth extraction   . Cesarean section   . Cholecystectomy     Family History  Problem Relation Age of Onset  . Other Neg Hx     History  Substance Use Topics  . Smoking status: Never Smoker   . Smokeless tobacco: Not on file  . Alcohol Use: No    Allergies:  Allergies  Allergen Reactions  . Morphine And Related Hives  . Unasyn (Ampicillin-Sulbactam Sodium) Hives    Prescriptions prior to admission  Medication Sig Dispense Refill  . acetaminophen (TYLENOL) 325 MG tablet Take 325 mg by mouth every 6 (six) hours as needed.      Marland Kitchen HYDROcodone-acetaminophen (NORCO) 5-325 MG per tablet Take 1.5 tablets by mouth every 4 (four) hours as needed. For pain.       Marland Kitchen ibuprofen (ADVIL,MOTRIN) 200 MG tablet Take 400 mg by mouth every 6 (six) hours as needed. For pain.       . Norethindrone Acet-Ethinyl Est (LOESTRIN 1.5/30, 21, PO) Take 1 tablet by mouth daily.          Review of Systems  Constitutional: Negative for fever and chills.  Gastrointestinal: Positive for nausea and abdominal pain. Negative for vomiting, diarrhea and constipation.  Genitourinary: Negative for dysuria, urgency, frequency and hematuria.  Musculoskeletal: Negative for myalgias.  Neurological: Negative for dizziness.   Physical Exam    Blood pressure 123/80, pulse 87, temperature 97.6 F (36.4 C), resp. rate 20, height 5\' 4"  (1.626 m), weight 94.53 kg (208 lb 6.4 oz), last menstrual period 02/14/2012.  Physical Exam  Nursing note and vitals reviewed. Constitutional: She is oriented to person, place, and time. She appears well-developed and well-nourished. No distress.  Cardiovascular: Normal rate.   Respiratory: Effort normal.  GI: Soft. She exhibits no distension. There is no tenderness.  Genitourinary:        FHT: 130, moderate with 15x15 accels. No decels Toco: no UC's No CVA tenderness External: normal Vagina: small amount of white discharge Cervix: closed/50/-3/soft Uterus: AGA  Neurological: She is alert and oriented to person, place, and time.  Skin: Skin is warm and dry.  Psychiatric: She has a normal mood and affect.    MAU Course  Procedures  Results for orders placed during the hospital encounter of 12/12/12 (from the past 24 hour(s))  URINALYSIS, ROUTINE W REFLEX MICROSCOPIC     Status: Abnormal   Collection Time   12/12/12  2:40 AM      Component Value Range   Color, Urine YELLOW  YELLOW   APPearance CLEAR  CLEAR   Specific Gravity, Urine 1.020  1.005 - 1.030   pH 6.0  5.0 - 8.0   Glucose, UA NEGATIVE  NEGATIVE mg/dL   Hgb  urine dipstick LARGE (*) NEGATIVE   Bilirubin Urine NEGATIVE  NEGATIVE   Ketones, ur NEGATIVE  NEGATIVE mg/dL   Protein, ur 30 (*) NEGATIVE mg/dL   Urobilinogen, UA 0.2  0.0 - 1.0 mg/dL   Nitrite NEGATIVE  NEGATIVE   Leukocytes, UA TRACE (*) NEGATIVE  URINE MICROSCOPIC-ADD ON     Status: Normal   Collection Time   12/12/12  2:40 AM      Component Value Range   Squamous Epithelial / LPF RARE  RARE   WBC, UA 0-2  <3 WBC/hpf   RBC / HPF 0-2  <3 RBC/hpf   0410: Spoke with Dr. Henderson Cloud. Discussed likelihood of this being primary back pain. Will give patient Flexeril, and OK for DC home.  Assessment and Plan   1. Pregnancy with low back pain, antepartum    Back  stretches/exercises given PTL danger signs reviewed FU with PCP as scheduled   Tawnya Crook 12/12/2012, 4:01 AM

## 2012-12-12 NOTE — Progress Notes (Signed)
Spec exam done.. FFN and wet prep obtained.

## 2012-12-12 NOTE — Progress Notes (Signed)
Back pain in preg. Sheet given to pt. WRitten and verbal d/c instructions given and understanding voiced

## 2013-02-02 ENCOUNTER — Encounter (HOSPITAL_COMMUNITY): Payer: Self-pay

## 2013-02-02 NOTE — H&P (Signed)
Whitney Wilson  DICTATION # [id number] CSN# [id number]   Meriel Pica, MD 02/02/2013 12:39 PM

## 2013-02-02 NOTE — H&P (Signed)
Whitney Wilson, Whitney Wilson             ACCOUNT NO.:  [id number]  MEDICAL RECORD NO.:  [id number]  LOCATION:  PERIO                         FACILITY:  [facility]  PHYSICIAN:  Duke Salvia. Marcelle Overlie, M.D.DATE OF BIRTH:  1991/11/07  DATE OF ADMISSION:  01/05/2013 DATE OF DISCHARGE:                             HISTORY & PHYSICAL   CHIEF COMPLAINT:  Repeat cesarean section at term.  HISTORY OF PRESENT ILLNESS:  A 22 year old, G2, P71, EDD February 17, 2013.  The patient had a normal first trimester screen along with her AFP only, declined CF screening.  Remainder of her pregnancy has been unremarkable except for carpal tunnel syndrome.  Her GBS is negative.  Blood type is O positive.  She had one previous cesarean section in 2011 for failure to progress, declines VBAC.  The procedure of cesarean section including specific risks related to bleeding, infection, transfusion, adjacent organ injury, wound infection, phlebitis all discussed with her, which she understands and accepts.  PAST MEDICAL HISTORY:  Please see her [facility] form for details.  PHYSICAL EXAMINATION:  VITAL SIGNS:  Temperature 98.2, blood pressure 120/78. HEENT:  Unremarkable. NECK:  Supple without masses. LUNGS:  Clear. CARDIOVASCULAR:  Regular rate and rhythm without murmurs, rubs, or gallops noted. BREASTS:  Without masses.  Term fundal height.  Fetal heart rate 140. Cervix was closed. EXTREMITIES:  Unremarkable. NEUROLOGIC:  Unremarkable.  IMPRESSION:  Term pregnancy, previous cesarean section for repeat.  PLAN:  Repeat cesarean section.  Procedure and risks were discussed as above.     Whitney Wilson M. Marcelle Overlie, M.D.     [facility]/MEDQ  D:  02/02/2013  T:  02/02/2013  Job:  [id number]

## 2013-02-05 ENCOUNTER — Encounter (HOSPITAL_COMMUNITY): Payer: Self-pay

## 2013-02-08 ENCOUNTER — Encounter (HOSPITAL_COMMUNITY)
Admission: RE | Admit: 2013-02-08 | Discharge: 2013-02-08 | Disposition: A | Discharge status: Home / Self Care | Payer: Medicaid Other | Source: Ambulatory Visit | Attending: Obstetrics and Gynecology | Admitting: Obstetrics and Gynecology

## 2013-02-08 ENCOUNTER — Encounter (HOSPITAL_COMMUNITY): Payer: Self-pay

## 2013-02-08 HISTORY — DX: Other specified postprocedural states: R11.2

## 2013-02-08 HISTORY — DX: Carpal tunnel syndrome, unspecified upper limb: G56.00

## 2013-02-08 HISTORY — DX: Other specified postprocedural states: Z98.890

## 2013-02-08 HISTORY — DX: Gastro-esophageal reflux disease without esophagitis: K21.9

## 2013-02-08 HISTORY — DX: Carpal tunnel syndrome, unspecified upper limb: O99.350

## 2013-02-08 LAB — CBC
HCT: 34.4 % — ABNORMAL LOW (ref 36.0–46.0)
Hemoglobin: 11.1 g/dL — ABNORMAL LOW (ref 12.0–15.0)
MCH: 26.2 pg (ref 26.0–34.0)
MCHC: 32.3 g/dL (ref 30.0–36.0)
MCV: 81.3 fL (ref 78.0–100.0)
RBC: 4.23 MIL/uL (ref 3.87–5.11)

## 2013-02-08 LAB — TYPE AND SCREEN: ABO/RH(D): O POS

## 2013-02-08 LAB — ABO/RH: ABO/RH(D): O POS

## 2013-02-08 NOTE — Patient Instructions (Addendum)
   Your procedure is scheduled on:Wednesday February 19th  Enter through the [employer] of [facility] at:11:30am Pick up the phone at the desk and dial [phone] and inform us of your arrival.  Please call this number if you have any problems the morning of surgery: [phone]  Remember: Do not eat food after midnight on Tuesday You may have clear liquids until 9am on Wednesday then nothing You may take your zantac morning of surgery.  Do not wear jewelry, make-up, or FINGER nail polish No metal in your hair or on your body. Do not wear lotions, powders, perfumes. You may wear deodorant.  Please use your CHG wash as directed prior to surgery.  Do not shave anywhere for at least 12 hours prior to first CHG shower.  Do not bring valuables to the hospital. .  Leave suitcase in the car. After Surgery it may be brought to your room. For patients being admitted to the hospital, checkout time is 11:00am the day of discharge.

## 2013-02-09 MED ORDER — GENTAMICIN SULFATE 40 MG/ML IJ SOLN
INTRAVENOUS | Status: AC
  Administered 2013-02-10: 100 mL via INTRAVENOUS
  Filled 2013-02-09: qty 8.98

## 2013-02-10 ENCOUNTER — Inpatient Hospital Stay (HOSPITAL_COMMUNITY)
Admission: RE | Admit: 2013-02-10 | Discharge: 2013-02-13 | DRG: 766 | Disposition: A | Discharge status: Home / Self Care | Payer: Medicaid Other | Source: Ambulatory Visit | Attending: Obstetrics and Gynecology | Admitting: Obstetrics and Gynecology

## 2013-02-10 ENCOUNTER — Encounter (HOSPITAL_COMMUNITY)
Admission: RE | Disposition: A | Discharge status: Home / Self Care | Payer: Self-pay | Source: Ambulatory Visit | Attending: Obstetrics and Gynecology

## 2013-02-10 ENCOUNTER — Inpatient Hospital Stay (HOSPITAL_COMMUNITY): Payer: Medicaid Other | Admitting: Anesthesiology

## 2013-02-10 ENCOUNTER — Encounter (HOSPITAL_COMMUNITY): Payer: Self-pay | Admitting: *Deleted

## 2013-02-10 ENCOUNTER — Encounter (HOSPITAL_COMMUNITY): Payer: Self-pay | Admitting: Anesthesiology

## 2013-02-10 DIAGNOSIS — O34219 Maternal care for unspecified type scar from previous cesarean delivery: Principal | ICD-10-CM | POA: Diagnosis present

## 2013-02-10 SURGERY — Surgical Case
Anesthesia: Spinal | Site: Abdomen | Wound class: Clean Contaminated

## 2013-02-10 MED ORDER — FAMOTIDINE 20 MG PO TABS
ORAL_TABLET | ORAL | Status: AC
  Administered 2013-02-10: 20 mg via ORAL
  Filled 2013-02-10: qty 1

## 2013-02-10 MED ORDER — METHYLERGONOVINE MALEATE 0.2 MG/ML IJ SOLN: 0.2000 mg | Freq: Once | INTRAMUSCULAR | Status: AC

## 2013-02-10 MED ORDER — DIPHENHYDRAMINE HCL 50 MG/ML IJ SOLN: 25.0000 mg | INTRAMUSCULAR | Status: DC | PRN

## 2013-02-10 MED ORDER — ONDANSETRON HCL 4 MG PO TABS: 4.0000 mg | ORAL_TABLET | ORAL | Status: DC | PRN

## 2013-02-10 MED ORDER — SODIUM CHLORIDE 0.9 % IJ SOLN: 3.0000 mL | INTRAMUSCULAR | Status: DC | PRN

## 2013-02-10 MED ORDER — OXYTOCIN 40 UNITS IN LACTATED RINGERS INFUSION - SIMPLE MED
INTRAVENOUS | Status: AC
  Filled 2013-02-10: qty 1000

## 2013-02-10 MED ORDER — OXYTOCIN 10 UNIT/ML IJ SOLN
INTRAMUSCULAR | Status: AC
  Filled 2013-02-10: qty 4

## 2013-02-10 MED ORDER — SODIUM CHLORIDE 0.9 % IJ SOLN: 9.0000 mL | INTRAMUSCULAR | Status: DC | PRN

## 2013-02-10 MED ORDER — FLEET ENEMA 7-19 GM/118ML RE ENEM: 1.0000 | ENEMA | Freq: Every day | RECTAL | Status: DC | PRN

## 2013-02-10 MED ORDER — FAMOTIDINE 20 MG PO TABS
20.0000 mg | ORAL_TABLET | Freq: Once | ORAL | Status: AC
  Administered 2013-02-10: 20 mg via ORAL

## 2013-02-10 MED ORDER — SIMETHICONE 80 MG PO CHEW
80.0000 mg | CHEWABLE_TABLET | Freq: Three times a day (TID) | ORAL | Status: DC
  Administered 2013-02-10 – 2013-02-13 (×9): 80 mg via ORAL

## 2013-02-10 MED ORDER — METHYLERGONOVINE MALEATE 0.2 MG/ML IJ SOLN
INTRAMUSCULAR | Status: AC
  Administered 2013-02-10: 0.2 mg
  Filled 2013-02-10: qty 1

## 2013-02-10 MED ORDER — KETOROLAC TROMETHAMINE 30 MG/ML IJ SOLN: 30.0000 mg | Freq: Four times a day (QID) | INTRAMUSCULAR | Status: AC | PRN

## 2013-02-10 MED ORDER — MEASLES, MUMPS & RUBELLA VAC ~~LOC~~ INJ
0.5000 mL | INJECTION | Freq: Once | SUBCUTANEOUS | Status: DC
  Filled 2013-02-10: qty 0.5

## 2013-02-10 MED ORDER — WITCH HAZEL-GLYCERIN EX PADS: 1.0000 "application " | MEDICATED_PAD | CUTANEOUS | Status: DC | PRN

## 2013-02-10 MED ORDER — IBUPROFEN 800 MG PO TABS: 800.0000 mg | ORAL_TABLET | Freq: Three times a day (TID) | ORAL | Status: DC | PRN

## 2013-02-10 MED ORDER — SCOPOLAMINE 1 MG/3DAYS TD PT72
1.0000 | MEDICATED_PATCH | Freq: Once | TRANSDERMAL | Status: DC
  Filled 2013-02-10: qty 1

## 2013-02-10 MED ORDER — ONDANSETRON HCL 4 MG/2ML IJ SOLN: 4.0000 mg | INTRAMUSCULAR | Status: DC | PRN

## 2013-02-10 MED ORDER — EPHEDRINE 5 MG/ML INJ
INTRAVENOUS | Status: AC
  Filled 2013-02-10: qty 10

## 2013-02-10 MED ORDER — DIPHENHYDRAMINE HCL 25 MG PO CAPS: 25.0000 mg | ORAL_CAPSULE | Freq: Four times a day (QID) | ORAL | Status: DC | PRN

## 2013-02-10 MED ORDER — NALBUPHINE HCL 10 MG/ML IJ SOLN
5.0000 mg | INTRAMUSCULAR | Status: DC | PRN
  Filled 2013-02-10: qty 1

## 2013-02-10 MED ORDER — FENTANYL CITRATE 0.05 MG/ML IJ SOLN
25.0000 ug | INTRAMUSCULAR | Status: DC | PRN
  Administered 2013-02-10 (×2): 50 ug via INTRAVENOUS

## 2013-02-10 MED ORDER — DIPHENHYDRAMINE HCL 25 MG PO CAPS: 25.0000 mg | ORAL_CAPSULE | ORAL | Status: DC | PRN

## 2013-02-10 MED ORDER — ONDANSETRON HCL 4 MG/2ML IJ SOLN: 4.0000 mg | Freq: Three times a day (TID) | INTRAMUSCULAR | Status: DC | PRN

## 2013-02-10 MED ORDER — DIBUCAINE 1 % RE OINT: 1.0000 "application " | TOPICAL_OINTMENT | RECTAL | Status: DC | PRN

## 2013-02-10 MED ORDER — SENNOSIDES-DOCUSATE SODIUM 8.6-50 MG PO TABS
2.0000 | ORAL_TABLET | Freq: Every day | ORAL | Status: DC
  Administered 2013-02-10 – 2013-02-12 (×3): 2 via ORAL

## 2013-02-10 MED ORDER — BUPIVACAINE IN DEXTROSE 0.75-8.25 % IT SOLN
INTRATHECAL | Status: DC | PRN
  Administered 2013-02-10: 1.4 mL via INTRATHECAL

## 2013-02-10 MED ORDER — OXYCODONE-ACETAMINOPHEN 5-325 MG PO TABS
1.0000 | ORAL_TABLET | Freq: Four times a day (QID) | ORAL | Status: DC | PRN
  Administered 2013-02-11: 2 via ORAL
  Administered 2013-02-11: 1 via ORAL
  Administered 2013-02-11 – 2013-02-12 (×2): 2 via ORAL
  Administered 2013-02-12 – 2013-02-13 (×5): 1 via ORAL
  Filled 2013-02-10: qty 2
  Filled 2013-02-10: qty 1
  Filled 2013-02-10: qty 2
  Filled 2013-02-10 (×3): qty 1
  Filled 2013-02-10: qty 2
  Filled 2013-02-10: qty 1
  Filled 2013-02-10: qty 2

## 2013-02-10 MED ORDER — FENTANYL CITRATE 0.05 MG/ML IJ SOLN
INTRAMUSCULAR | Status: AC
  Filled 2013-02-10: qty 2

## 2013-02-10 MED ORDER — ZOLPIDEM TARTRATE 5 MG PO TABS: 5.0000 mg | ORAL_TABLET | Freq: Every evening | ORAL | Status: DC | PRN

## 2013-02-10 MED ORDER — SODIUM CHLORIDE 0.9 % IJ SOLN: 3.0000 mL | Freq: Two times a day (BID) | INTRAMUSCULAR | Status: DC

## 2013-02-10 MED ORDER — LACTATED RINGERS IV SOLN
INTRAVENOUS | Status: DC | PRN
  Administered 2013-02-10: 13:00:00 via INTRAVENOUS

## 2013-02-10 MED ORDER — PHENYLEPHRINE 40 MCG/ML (10ML) SYRINGE FOR IV PUSH (FOR BLOOD PRESSURE SUPPORT)
PREFILLED_SYRINGE | INTRAVENOUS | Status: AC
  Filled 2013-02-10: qty 15

## 2013-02-10 MED ORDER — SCOPOLAMINE 1 MG/3DAYS TD PT72
MEDICATED_PATCH | TRANSDERMAL | Status: AC
  Administered 2013-02-10: 1.5 mg via TRANSDERMAL
  Filled 2013-02-10: qty 1

## 2013-02-10 MED ORDER — NALOXONE HCL 1 MG/ML IJ SOLN
1.0000 ug/kg/h | INTRAVENOUS | Status: DC | PRN
  Filled 2013-02-10: qty 2

## 2013-02-10 MED ORDER — KETOROLAC TROMETHAMINE 60 MG/2ML IM SOLN
INTRAMUSCULAR | Status: AC
  Administered 2013-02-10: 60 mg via INTRAMUSCULAR
  Filled 2013-02-10: qty 2

## 2013-02-10 MED ORDER — FENTANYL CITRATE 0.05 MG/ML IJ SOLN
INTRAMUSCULAR | Status: AC
  Administered 2013-02-10: 50 ug via INTRAVENOUS
  Filled 2013-02-10: qty 2

## 2013-02-10 MED ORDER — BISACODYL 10 MG RE SUPP: 10.0000 mg | Freq: Every day | RECTAL | Status: DC | PRN

## 2013-02-10 MED ORDER — DIPHENHYDRAMINE HCL 50 MG/ML IJ SOLN: 12.5000 mg | Freq: Four times a day (QID) | INTRAMUSCULAR | Status: DC | PRN

## 2013-02-10 MED ORDER — SODIUM CHLORIDE 0.9 % IV SOLN: 250.0000 mL | INTRAVENOUS | Status: DC

## 2013-02-10 MED ORDER — ACETAMINOPHEN 10 MG/ML IV SOLN
1000.0000 mg | Freq: Four times a day (QID) | INTRAVENOUS | Status: DC
Start: 2013-02-10 — End: 2013-02-10
  Administered 2013-02-10: 1000 mg via INTRAVENOUS
  Filled 2013-02-10: qty 100

## 2013-02-10 MED ORDER — LACTATED RINGERS IV SOLN
INTRAVENOUS | Status: DC
  Administered 2013-02-10 (×3): via INTRAVENOUS

## 2013-02-10 MED ORDER — ONDANSETRON HCL 4 MG/2ML IJ SOLN: 4.0000 mg | Freq: Four times a day (QID) | INTRAMUSCULAR | Status: DC | PRN

## 2013-02-10 MED ORDER — TETANUS-DIPHTH-ACELL PERTUSSIS 5-2.5-18.5 LF-MCG/0.5 IM SUSP: 0.5000 mL | Freq: Once | INTRAMUSCULAR | Status: DC

## 2013-02-10 MED ORDER — FENTANYL CITRATE 0.05 MG/ML IJ SOLN
INTRAMUSCULAR | Status: DC | PRN
  Administered 2013-02-10: 15 ug via INTRATHECAL

## 2013-02-10 MED ORDER — MENTHOL 3 MG MT LOZG: 1.0000 | LOZENGE | OROMUCOSAL | Status: DC | PRN

## 2013-02-10 MED ORDER — DIPHENHYDRAMINE HCL 50 MG/ML IJ SOLN: 12.5000 mg | INTRAMUSCULAR | Status: DC | PRN

## 2013-02-10 MED ORDER — PRENATAL MULTIVITAMIN CH
1.0000 | ORAL_TABLET | Freq: Every day | ORAL | Status: DC
  Administered 2013-02-11 – 2013-02-13 (×3): 1 via ORAL
  Filled 2013-02-10 (×3): qty 1

## 2013-02-10 MED ORDER — IBUPROFEN 600 MG PO TABS
600.0000 mg | ORAL_TABLET | Freq: Four times a day (QID) | ORAL | Status: DC | PRN
  Administered 2013-02-11 – 2013-02-13 (×10): 600 mg via ORAL
  Filled 2013-02-10 (×10): qty 1

## 2013-02-10 MED ORDER — ONDANSETRON HCL 4 MG/2ML IJ SOLN
INTRAMUSCULAR | Status: AC
  Filled 2013-02-10: qty 2

## 2013-02-10 MED ORDER — SIMETHICONE 80 MG PO CHEW
80.0000 mg | CHEWABLE_TABLET | ORAL | Status: DC | PRN
  Administered 2013-02-12 – 2013-02-13 (×2): 80 mg via ORAL

## 2013-02-10 MED ORDER — DIPHENHYDRAMINE HCL 12.5 MG/5ML PO ELIX
12.5000 mg | ORAL_SOLUTION | Freq: Four times a day (QID) | ORAL | Status: DC | PRN
  Filled 2013-02-10: qty 5

## 2013-02-10 MED ORDER — METOCLOPRAMIDE HCL 5 MG/ML IJ SOLN: 10.0000 mg | Freq: Three times a day (TID) | INTRAMUSCULAR | Status: DC | PRN

## 2013-02-10 MED ORDER — KETOROLAC TROMETHAMINE 60 MG/2ML IM SOLN
60.0000 mg | Freq: Once | INTRAMUSCULAR | Status: AC | PRN
  Administered 2013-02-10: 60 mg via INTRAMUSCULAR
  Filled 2013-02-10: qty 2

## 2013-02-10 MED ORDER — LANOLIN HYDROUS EX OINT: 1.0000 "application " | TOPICAL_OINTMENT | CUTANEOUS | Status: DC | PRN

## 2013-02-10 MED ORDER — HYDROMORPHONE 0.3 MG/ML IV SOLN
INTRAVENOUS | Status: DC
  Administered 2013-02-10: 7.2 mg via INTRAVENOUS
  Administered 2013-02-10 – 2013-02-11 (×2): via INTRAVENOUS
  Administered 2013-02-11: 1.3 mg via INTRAVENOUS
  Administered 2013-02-11: 11.7 mg via INTRAVENOUS
  Administered 2013-02-11: 2.7 mg via INTRAVENOUS
  Filled 2013-02-10 (×2): qty 25

## 2013-02-10 MED ORDER — OXYTOCIN 40 UNITS IN LACTATED RINGERS INFUSION - SIMPLE MED: 62.5000 mL/h | INTRAVENOUS | Status: AC

## 2013-02-10 MED ORDER — NALOXONE HCL 0.4 MG/ML IJ SOLN: 0.4000 mg | INTRAMUSCULAR | Status: DC | PRN

## 2013-02-10 MED ORDER — SODIUM CHLORIDE 0.9 % IR SOLN
Status: DC | PRN
  Administered 2013-02-10: 1000 mL

## 2013-02-10 MED ORDER — OXYTOCIN 10 UNIT/ML IJ SOLN
40.0000 [IU] | INTRAVENOUS | Status: DC | PRN
  Administered 2013-02-10: 40 [IU] via INTRAVENOUS

## 2013-02-10 MED ORDER — ONDANSETRON HCL 4 MG/2ML IJ SOLN
INTRAMUSCULAR | Status: DC | PRN
  Administered 2013-02-10: 4 mg via INTRAVENOUS

## 2013-02-10 MED ORDER — MORPHINE SULFATE 0.5 MG/ML IJ SOLN
INTRAMUSCULAR | Status: AC
  Filled 2013-02-10: qty 10

## 2013-02-10 MED ORDER — ACETAMINOPHEN 10 MG/ML IV SOLN
1000.0000 mg | Freq: Four times a day (QID) | INTRAVENOUS | Status: AC
  Filled 2013-02-10 (×4): qty 100

## 2013-02-10 MED ORDER — MEPERIDINE HCL 25 MG/ML IJ SOLN: 6.2500 mg | INTRAMUSCULAR | Status: DC | PRN

## 2013-02-10 MED ORDER — SCOPOLAMINE 1 MG/3DAYS TD PT72
1.0000 | MEDICATED_PATCH | Freq: Once | TRANSDERMAL | Status: DC
  Administered 2013-02-10: 1.5 mg via TRANSDERMAL

## 2013-02-10 SURGICAL SUPPLY — 32 items
APL SKNCLS STERI-STRIP NONHPOA (GAUZE/BANDAGES/DRESSINGS) ×1
BENZOIN TINCTURE PRP APPL 2/3 (GAUZE/BANDAGES/DRESSINGS) ×1 IMPLANT
CLOTH BEACON ORANGE TIMEOUT ST (SAFETY) ×2 IMPLANT
DRAPE LG THREE QUARTER DISP (DRAPES) ×2 IMPLANT
DRESSING TELFA 8X3 (GAUZE/BANDAGES/DRESSINGS) IMPLANT
DRSG OPSITE POSTOP 4X10 (GAUZE/BANDAGES/DRESSINGS) ×2 IMPLANT
DURAPREP 26ML APPLICATOR (WOUND CARE) ×2 IMPLANT
ELECT REM PT RETURN 9FT ADLT (ELECTROSURGICAL) ×2
ELECTRODE REM PT RTRN 9FT ADLT (ELECTROSURGICAL) ×1 IMPLANT
EXTRACTOR VACUUM M CUP 4 TUBE (SUCTIONS) IMPLANT
GAUZE SPONGE 4X4 12PLY STRL LF (GAUZE/BANDAGES/DRESSINGS) ×2 IMPLANT
GLOVE BIO SURGEON STRL SZ7 (GLOVE) ×4 IMPLANT
GOWN PREVENTION PLUS LG XLONG (DISPOSABLE) ×5 IMPLANT
KIT ABG SYR 3ML LUER SLIP (SYRINGE) IMPLANT
NDL HYPO 25X5/8 SAFETYGLIDE (NEEDLE) ×1 IMPLANT
NEEDLE HYPO 25X5/8 SAFETYGLIDE (NEEDLE) ×2 IMPLANT
NS IRRIG 1000ML POUR BTL (IV SOLUTION) ×2 IMPLANT
PACK C SECTION WH (CUSTOM PROCEDURE TRAY) ×2 IMPLANT
PAD ABD 7.5X8 STRL (GAUZE/BANDAGES/DRESSINGS) ×2 IMPLANT
PAD OB MATERNITY 4.3X12.25 (PERSONAL CARE ITEMS) ×2 IMPLANT
SLEEVE SCD COMPRESS KNEE MED (MISCELLANEOUS) IMPLANT
STRIP CLOSURE SKIN 1/2X4 (GAUZE/BANDAGES/DRESSINGS) ×2 IMPLANT
STRIP CLOSURE SKIN 1/4X4 (GAUZE/BANDAGES/DRESSINGS) ×1 IMPLANT
SUT CHROMIC 0 CTX 36 (SUTURE) ×7 IMPLANT
SUT MON AB 4-0 PS1 27 (SUTURE) ×2 IMPLANT
SUT PDS AB 0 CT1 27 (SUTURE) ×4 IMPLANT
SUT VIC AB 3-0 CT1 27 (SUTURE) ×4
SUT VIC AB 3-0 CT1 TAPERPNT 27 (SUTURE) ×2 IMPLANT
TAPE CLOTH SURG 4X10 WHT LF (GAUZE/BANDAGES/DRESSINGS) ×1 IMPLANT
TOWEL OR 17X24 6PK STRL BLUE (TOWEL DISPOSABLE) ×6 IMPLANT
TRAY FOLEY CATH 14FR (SET/KITS/TRAYS/PACK) ×2 IMPLANT
WATER STERILE IRR 1000ML POUR (IV SOLUTION) ×2 IMPLANT

## 2013-02-10 NOTE — Op Note (Signed)
Preoperative diagnosis: Term pregnancy, for repeat cesarean section  Postoperative diagnosis: Same  Procedure: Repeat low transverse cesarean section  Surgeon: Marcelle Overlie  Anesthesia: Spinal  EBL: 800 cc  Complications: None  Drains: Foley to straight drain  Procedure and findings:  Patient taken the operating room after an adequate level of spinal anesthetic was obtained with the patient in left tilt position the abdomen prepped and draped in usual fashion for cesarean section, Foley catheter positioned draining clear urine. Appropriate timeout for taken at that point.  Transverse incision was made excising the old scar which is carried down to the fascia. Fascia divided horizontally, rectus muscle divided in the midline peritoneum entered superiorly without incident and extended in a vertical fashion. Bladder blade was positioned at that point, the vesicouterine serosa was incised and the bladder was bluntly and sharply dissected below, bladder blade repositioned. Transverse incision made in the lower segment extended with blunt dissection clear fluid noted the patient delivered of a healthy female Apgars 9 and 9, the infant was suctioned, cord clamped and passed the pediatric team for further care. The placenta was then delivered manually intact. Uterus exteriorized cavity wiped clean with laparotomy pack closure obtained the first layer of 0 chromic in a locked fashion followed by Dimetane layer of 0 chromic. This is hemostatic the bladder flap area was intact and hemostatic. Bilateral tubes and ovaries were normal prior to closure sponge denies precast reported as correct x2 peritoneum closed with a 2-0 Vicryl suture. 2-0 Vicryl interrupted sutures used to close the rectus muscles in the midline. A 0 PDS suture was then used to close the fascia from laterally to midline on either side. Subcutaneous tissue was undermined to reduce tension, this was made hemostatic with the Bovie and closed with a  separate layer of 3-0 Vicryl to close the dead space. This is hemostatic 4-0 Monocryl subcuticular closure along with a sterile pressure dressing. She tolerated this well went to recovery room in good condition.  Dictated with [facility]  Audry Pecina M. Milana Obey.D.

## 2013-02-10 NOTE — OR Nursing (Signed)
Uterus massaged by S. Charrise Lardner RN. Two tubes of cord blood sent to lab.  20cc of blood evacuated from uterus during uterine massage. 

## 2013-02-10 NOTE — Anesthesia Preprocedure Evaluation (Addendum)
Anesthesia Evaluation  Patient identified by MRN, date of birth, ID band Patient awake    Reviewed: Allergy & Precautions, H&P , NPO status , Patient's Chart, lab work & pertinent test results, reviewed documented beta blocker date and time   History of Anesthesia Complications (+) PONV  Airway Mallampati: II TM Distance: >3 FB Neck ROM: full  Mouth opening: Limited Mouth Opening  Dental  (+)    Pulmonary neg pulmonary ROS,  breath sounds clear to auscultation  Pulmonary exam normal       Cardiovascular negative cardio ROS  Rhythm:regular Rate:Normal     Neuro/Psych negative neurological ROS  negative psych ROS   GI/Hepatic Neg liver ROS, GERD-  Medicated,  Endo/Other  obese  Renal/GU negative Renal ROS  negative genitourinary   Musculoskeletal   Abdominal Normal abdominal exam  (+)   Peds  Hematology negative hematology ROS (+)   Anesthesia Other Findings   Reproductive/Obstetrics (+) Pregnancy (h/o prior c/s x1)                          Anesthesia Physical Anesthesia Plan  ASA: II  Anesthesia Plan: Spinal   Post-op Pain Management:    Induction:   Airway Management Planned:   Additional Equipment:   Intra-op Plan:   Post-operative Plan:   Informed Consent: I have reviewed the patients History and Physical, chart, labs and discussed the procedure including the risks, benefits and alternatives for the proposed anesthesia with the patient or authorized representative who has indicated his/her understanding and acceptance.     Plan Discussed with: Surgeon and CRNA  Anesthesia Plan Comments:         Anesthesia Quick Evaluation

## 2013-02-10 NOTE — Consult Note (Signed)
Neonatology Note:   Attendance at C-section:    I was asked to attend this repeat C/S at term. The mother is a G2P1 O pos, GBS neg with an uncomplicated pregnancy. ROM at delivery, fluid clear. Infant vigorous with good spontaneous cry and tone. Needed only minimal bulb suctioning. Ap 9/9. Lungs clear to ausc in DR. To CN to care of Pediatrician.   Deatra James, MD

## 2013-02-10 NOTE — Progress Notes (Signed)
The patient was re-examined with no change in status 

## 2013-02-10 NOTE — Anesthesia Postprocedure Evaluation (Signed)
  Anesthesia Post-op Note  Anesthesia Post Note  Patient: Whitney Wilson  Procedure(s) Performed: Procedure(s) (LRB): Repeat cesarean section with delivery of baby girl at 26. Apgars 9/9. (N/A)  Anesthesia type: Spinal  Patient location: PACU  Post pain: Pain level controlled  Post assessment: Post-op Vital signs reviewed  Last Vitals:  Filed Vitals:   02/10/13 1445  BP: 119/50  Pulse: 75  Temp:   Resp: 20    Post vital signs: Reviewed  Level of consciousness: awake  Complications: No apparent anesthesia complications

## 2013-02-10 NOTE — Transfer of Care (Signed)
Immediate Anesthesia Transfer of Care Note  Patient: Whitney Wilson  Procedure(s) Performed: Procedure(s) with comments: CESAREAN SECTION (N/A) - Repeat edc 02/17/13  Patient Location: PACU  Anesthesia Type:Spinal  Level of Consciousness: awake, alert  and oriented  Airway & Oxygen Therapy: Patient Spontanous Breathing  Post-op Assessment: Report given to PACU RN  Post vital signs: Reviewed and stable  Complications: No apparent anesthesia complications

## 2013-02-10 NOTE — Anesthesia Procedure Notes (Addendum)
Spinal  Patient location during procedure: OR Start time: 02/10/2013 1:00 PM Staffing Performed by: anesthesiologist  Preanesthetic Checklist Completed: patient identified, site marked, surgical consent, pre-op evaluation, timeout performed, IV checked, risks and benefits discussed and monitors and equipment checked Spinal Block Patient position: sitting Prep: site prepped and draped and DuraPrep Patient monitoring: heart rate, cardiac monitor, continuous pulse ox and blood pressure Approach: midline Location: L3-4 Injection technique: single-shot Needle Needle type: Sprotte  Needle gauge: 24 G Needle length: 9 cm Assessment Sensory level: T4 Additional Notes Clear free flow CSF on first attempt.  No paresthesia.  Patient tolerated procedure well with no apparent complications.  Jasmine December, MD

## 2013-02-11 ENCOUNTER — Encounter (HOSPITAL_COMMUNITY): Payer: Self-pay | Admitting: Obstetrics and Gynecology

## 2013-02-11 LAB — CBC
Hemoglobin: 9.3 g/dL — ABNORMAL LOW (ref 12.0–15.0)
MCHC: 31.4 g/dL (ref 30.0–36.0)
Platelets: 164 10*3/uL (ref 150–400)
RBC: 3.63 MIL/uL — ABNORMAL LOW (ref 3.87–5.11)

## 2013-02-11 NOTE — Anesthesia Postprocedure Evaluation (Signed)
  Anesthesia Post-op Note  Patient: Whitney Wilson  Procedure(s) Performed: Procedure(s) with comments: Repeat cesarean section with delivery of baby girl at 46. Apgars 9/9. (N/A) - Repeat edc 02/17/13  Patient Location: Mother/Baby  Anesthesia Type:Spinal  Level of Consciousness: awake, alert , oriented and patient cooperative  Airway and Oxygen Therapy: Patient Spontanous Breathing  Post-op Pain: mild  Post-op Assessment: Patient's Cardiovascular Status Stable, Respiratory Function Stable, Patent Airway, No signs of Nausea or vomiting, Adequate PO intake and Pain level controlled  Post-op Vital Signs: Reviewed and stable  Complications: No apparent anesthesia complications

## 2013-02-11 NOTE — Progress Notes (Signed)
UR chart review completed.  

## 2013-02-11 NOTE — Progress Notes (Signed)
Subjective: Postpartum Day 1: Cesarean Delivery Patient reports tolerating PO.    Objective: Vital signs in last 24 hours: Temp:  [97 F (36.1 C)-98.9 F (37.2 C)] 97 F (36.1 C) (02/20 0446) Pulse Rate:  [29-98] 60 (02/20 0459) Resp:  [16-99] 20 (02/20 0459) BP: (93-130)/(25-107) 115/74 mmHg (02/20 0459) SpO2:  [95 %-98 %] 95 % (02/20 0446)  Physical Exam:  General: alert and cooperative Lochia: appropriate Uterine Fundus: firm Incision: abd dressing CDI DVT Evaluation: No evidence of DVT seen on physical exam. No significant calf/ankle edema.   Recent Labs  02/08/13 1200 02/11/13 0520  HGB 11.1* 9.3*  HCT 34.4* 29.6*    Assessment/Plan: Status post Cesarean section. Doing well postoperatively.  Continue current care.  CURTIS,CAROL G 02/11/2013, 8:15 AM

## 2013-02-12 LAB — BIRTH TISSUE RECOVERY COLLECTION (PLACENTA DONATION)

## 2013-02-12 NOTE — Progress Notes (Signed)
Subjective: Postpartum Day 2: Cesarean Delivery Patient reports tolerating PO, + flatus and no problems voiding.    Objective: Vital signs in last 24 hours: Temp:  [98.2 F (36.8 C)-98.9 F (37.2 C)] 98.3 F (36.8 C) (02/21 0551) Pulse Rate:  [87-102] 97 (02/21 0551) Resp:  [18] 18 (02/21 0551) BP: (104-118)/(60-84) 104/71 mmHg (02/21 0551) SpO2:  [96 %] 96 % (02/20 0915)  Physical Exam:  General: alert and cooperative Lochia: appropriate Uterine Fundus: firm Incision: abd dressing partially intact. Honeycomb dressing CDI DVT Evaluation: No evidence of DVT seen on physical exam. Negative Homan's sign. No cords or calf tenderness. No significant calf/ankle edema.   Recent Labs  02/11/13 0520  HGB 9.3*  HCT 29.6*    Assessment/Plan: Status post Cesarean section. Doing well postoperatively.  Continue current care.  Canna Nickelson G 02/12/2013, 7:54 AM

## 2013-02-13 MED ORDER — OXYCODONE-ACETAMINOPHEN 5-325 MG PO TABS: 1.0000 | ORAL_TABLET | Freq: Four times a day (QID) | ORAL | Status: DC | PRN

## 2013-02-13 MED ORDER — PRENATAL MULTIVITAMIN CH: 1.0000 | ORAL_TABLET | Freq: Every day | ORAL | Status: DC

## 2013-02-13 MED ORDER — IBUPROFEN 600 MG PO TABS: 600.0000 mg | ORAL_TABLET | Freq: Four times a day (QID) | ORAL | Status: DC | PRN

## 2013-02-13 NOTE — Discharge Summary (Signed)
Obstetric Discharge Summary Reason for Admission: cesarean section Prenatal Procedures: ultrasound Intrapartum Procedures: cesarean: low cervical, transverse Postpartum Procedures: none Complications-Operative and Postpartum: none Hemoglobin  Date Value Range Status  02/11/2013 9.3* 12.0 - 15.0 g/dL Final     HCT  Date Value Range Status  02/11/2013 29.6* 36.0 - 46.0 % Final    Physical Exam:  General: alert and cooperative Lochia: appropriate Uterine Fundus: firm Incision: healing well, no significant drainage DVT Evaluation: No evidence of DVT seen on physical exam.  Discharge Diagnoses: Term Pregnancy-delivered  Discharge Information: Date: 02/13/2013 Activity: pelvic rest Diet: routine Medications: PNV, Ibuprofen and Percocet Condition: stable Instructions: refer to practice specific booklet Discharge to: home Follow-up Information   Schedule an appointment as soon as possible for a visit in 1 week to follow up.      Newborn Data: Live born female  Birth Weight: 6 lb 9.3 oz (2985 g) APGAR: 9, 9  Home with mother.  Prisila Dlouhy 02/13/2013, 6:33 AM

## 2013-04-22 ENCOUNTER — Encounter (HOSPITAL_COMMUNITY): Payer: Self-pay | Admitting: Emergency Medicine

## 2013-04-22 ENCOUNTER — Emergency Department (HOSPITAL_COMMUNITY)
Admission: EM | Admit: 2013-04-22 | Discharge: 2013-04-22 | Disposition: A | Discharge status: Home / Self Care | Payer: Self-pay | Attending: Emergency Medicine | Admitting: Emergency Medicine

## 2013-04-22 DIAGNOSIS — Z8719 Personal history of other diseases of the digestive system: Secondary | ICD-10-CM | POA: Insufficient documentation

## 2013-04-22 DIAGNOSIS — H6691 Otitis media, unspecified, right ear: Secondary | ICD-10-CM

## 2013-04-22 DIAGNOSIS — H6690 Otitis media, unspecified, unspecified ear: Secondary | ICD-10-CM | POA: Insufficient documentation

## 2013-04-22 MED ORDER — AMOXICILLIN 500 MG PO CAPS: 500.0000 mg | ORAL_CAPSULE | Freq: Once | ORAL | Status: DC

## 2013-04-22 MED ORDER — CLINDAMYCIN HCL 300 MG PO CAPS: 300.0000 mg | ORAL_CAPSULE | Freq: Three times a day (TID) | ORAL | Status: DC

## 2013-04-22 MED ORDER — CLINDAMYCIN HCL 300 MG PO CAPS
300.0000 mg | ORAL_CAPSULE | Freq: Once | ORAL | Status: AC
  Administered 2013-04-22: 300 mg via ORAL
  Filled 2013-04-22: qty 1

## 2013-04-22 MED ORDER — OXYCODONE-ACETAMINOPHEN 5-325 MG PO TABS
1.0000 | ORAL_TABLET | Freq: Once | ORAL | Status: AC
  Administered 2013-04-22: 1 via ORAL
  Filled 2013-04-22: qty 1

## 2013-04-22 NOTE — ED Provider Notes (Signed)
History     CSN: [id number]  Arrival date & time 04/22/13  4098   First MD Initiated Contact with Patient 04/22/13 [phone]      Chief Complaint  Patient presents with  . Otalgia    (Consider location/radiation/quality/duration/timing/severity/associated sxs/prior treatment) The history is provided by the patient.   patient reports right ear pain over the past 24 hours.  She has a history of recurrent right-sided ear infections.  No dental pain.  Symptoms are mild to moderate in severity.  No fevers or chills.  No recent trauma.  No other complaints.  No sore throat.  No cough.  No shortness of breath  Past Medical History  Diagnosis Date  . PONV (postoperative nausea and vomiting)   . GERD (gastroesophageal reflux disease)   . Pregnancy related carpal tunnel syndrome, antepartum     Past Surgical History  Procedure Laterality Date  . Wisdom tooth extraction    . Cesarean section    . Cholecystectomy  2011  . Cesarean section N/A 02/10/2013    Procedure: Repeat cesarean section with delivery of baby girl at 1328. Apgars 9/9.;  Surgeon: Meriel Pica, MD;  Location: [facility];  Service: Obstetrics;  Laterality: N/A;  Repeat edc 02/17/13    Family History  Problem Relation Age of Onset  . Other Neg Hx     History  Substance Use Topics  . Smoking status: Never Smoker   . Smokeless tobacco: Not on file  . Alcohol Use: No    OB History   Grav Para Term Preterm Abortions TAB SAB Ect Mult Living   2 2 2  0 0 0 0 0 0 1      Review of Systems  HENT: Positive for ear pain.   All other systems reviewed and are negative.    Allergies  Morphine and related and Unasyn  Home Medications   Current Outpatient Rx  Name  Route  Sig  Dispense  Refill  . acetaminophen (TYLENOL) 500 MG tablet   Oral   Take 500 mg by mouth every 6 (six) hours as needed for pain.         Marland Kitchen levonorgestrel (MIRENA) 20 MCG/24HR IUD   Intrauterine   1 each by Intrauterine route once.          . clindamycin (CLEOCIN) 300 MG capsule   Oral   Take 1 capsule (300 mg total) by mouth 3 (three) times daily.   21 capsule   0     BP 109/68  Pulse 82  Temp(Src) 97.8 F (36.6 C) (Oral)  Resp 18  SpO2 95%  Physical Exam  Nursing note and vitals reviewed. Constitutional: She is oriented to person, place, and time. She appears well-developed and well-nourished. No distress.  HENT:  Head: Normocephalic and atraumatic.  Left TM normal.  Right TM erythematous with some bulging.  No drainage in her right external auditory canal.  No mastoid tenderness  Eyes: EOM are normal.  Neck: Normal range of motion.  Cardiovascular: Normal rate, regular rhythm and normal heart sounds.   Pulmonary/Chest: Effort normal and breath sounds normal.  Abdominal: Soft. She exhibits no distension. There is no tenderness.  Musculoskeletal: Normal range of motion.  Neurological: She is alert and oriented to person, place, and time.  Skin: Skin is warm and dry.  Psychiatric: She has a normal mood and affect. Judgment normal.    ED Course  Procedures (including critical care time)  Labs Reviewed - No data to display  No results found.   1. Otitis media, right       MDM  Recurrent right otitis media.  Antibiotics.  She will need ENT followup        Lyanne Co, MD 04/22/13 [phone]

## 2013-04-22 NOTE — ED Notes (Signed)
Pt here for c/o rt ear pain x1 day

## 2013-10-31 ENCOUNTER — Encounter (HOSPITAL_COMMUNITY): Payer: Self-pay | Admitting: Emergency Medicine

## 2013-10-31 ENCOUNTER — Emergency Department (INDEPENDENT_AMBULATORY_CARE_PROVIDER_SITE_OTHER)
Admission: EM | Admit: 2013-10-31 | Discharge: 2013-10-31 | Disposition: A | Discharge status: Home / Self Care | Payer: 59 | Source: Home / Self Care | Attending: Family Medicine | Admitting: Family Medicine

## 2013-10-31 DIAGNOSIS — R1110 Vomiting, unspecified: Secondary | ICD-10-CM

## 2013-10-31 DIAGNOSIS — R197 Diarrhea, unspecified: Secondary | ICD-10-CM

## 2013-10-31 MED ORDER — ONDANSETRON 4 MG PO TBDP
4.0000 mg | ORAL_TABLET | Freq: Once | ORAL | Status: AC
  Administered 2013-10-31: 4 mg via ORAL

## 2013-10-31 MED ORDER — PROMETHAZINE HCL 25 MG PO TABS: 25.0000 mg | ORAL_TABLET | Freq: Four times a day (QID) | ORAL | Status: DC | PRN

## 2013-10-31 MED ORDER — SODIUM CHLORIDE 0.9 % IV SOLN
Freq: Once | INTRAVENOUS | Status: AC
  Administered 2013-10-31: 18:00:00 via INTRAVENOUS

## 2013-10-31 MED ORDER — ONDANSETRON 4 MG PO TBDP
ORAL_TABLET | ORAL | Status: AC
  Filled 2013-10-31: qty 1

## 2013-10-31 MED ORDER — IBUPROFEN 800 MG PO TABS
800.0000 mg | ORAL_TABLET | Freq: Once | ORAL | Status: AC
  Administered 2013-10-31: 800 mg via ORAL

## 2013-10-31 MED ORDER — IBUPROFEN 800 MG PO TABS
ORAL_TABLET | ORAL | Status: AC
  Filled 2013-10-31: qty 1

## 2013-10-31 NOTE — ED Provider Notes (Signed)
Medical screening examination/treatment/procedure(s) were performed by resident physician or non-physician practitioner and as supervising physician I was immediately available for consultation/collaboration.   KINDL,JAMES DOUGLAS MD.   James D Kindl, MD 10/31/13 2000 

## 2013-10-31 NOTE — ED Notes (Signed)
Pt. states she did not tolerate the coke well.  Given another cup of ice water.

## 2013-10-31 NOTE — ED Notes (Addendum)
PA notified that we don't have Phenergan.  Pt. Given 1 cup ice water, a coke and another blanketl

## 2013-10-31 NOTE — ED Notes (Signed)
C/o vomiting, diarrhea and chills onset last night. States she had a migraine last night and felt cold.  Vomit x 8 and diarrhea x 7.

## 2013-10-31 NOTE — ED Provider Notes (Signed)
CSN: [id number]     Arrival date & time 10/31/13  1519 History   First MD Initiated Contact with Patient 10/31/13 1635     Chief Complaint  Patient presents with  . Emesis  . Diarrhea   (Consider location/radiation/quality/duration/timing/severity/associated sxs/prior Treatment) Patient is a 22 y.o. female presenting with vomiting. The history is provided by the patient. No language interpreter was used.  Emesis Severity:  Severe Duration:  24 hours Timing:  Intermittent Number of daily episodes:  10 Quality:  Unable to specify Progression:  Worsening Chronicity:  New Relieved by:  Nothing Worsened by:  Nothing tried Associated symptoms: diarrhea   Associated symptoms: no abdominal pain   Risk factors: no alcohol use     Past Medical History  Diagnosis Date  . PONV (postoperative nausea and vomiting)   . GERD (gastroesophageal reflux disease)   . Pregnancy related carpal tunnel syndrome, antepartum    Past Surgical History  Procedure Laterality Date  . Wisdom tooth extraction    . Cesarean section    . Cholecystectomy  2011  . Cesarean section N/A 02/10/2013    Procedure: Repeat cesarean section with delivery of baby girl at 1328. Apgars 9/9.;  Surgeon: Meriel Pica, MD;  Location: [facility];  Service: Obstetrics;  Laterality: N/A;  Repeat edc 02/17/13   Family History  Problem Relation Age of Onset  . Other Neg Hx   . Diabetes Father    History  Substance Use Topics  . Smoking status: Never Smoker   . Smokeless tobacco: Not on file  . Alcohol Use: No   OB History   Grav Para Term Preterm Abortions TAB SAB Ect Mult Living   2 2 2  0 0 0 0 0 0 1     Review of Systems  Gastrointestinal: Positive for nausea, vomiting and diarrhea. Negative for abdominal pain.  All other systems reviewed and are negative.    Allergies  Morphine and related and Unasyn  Home Medications   Current Outpatient Rx  Name  Route  Sig  Dispense  Refill  . acetaminophen (TYLENOL)  500 MG tablet   Oral   Take 500 mg by mouth every 6 (six) hours as needed for pain.         Marland Kitchen levonorgestrel (MIRENA) 20 MCG/24HR IUD   Intrauterine   1 each by Intrauterine route once.         . clindamycin (CLEOCIN) 300 MG capsule   Oral   Take 1 capsule (300 mg total) by mouth 3 (three) times daily.   21 capsule   0    BP 101/70  Pulse 149  Temp(Src) 99.1 F (37.3 C) (Oral)  SpO2 98%  Breastfeeding? No Physical Exam  Nursing note and vitals reviewed. Constitutional: She is oriented to person, place, and time. She appears well-developed and well-nourished.  HENT:  Head: Normocephalic.  Eyes: EOM are normal. Pupils are equal, round, and reactive to light.  Neck: Normal range of motion.  Cardiovascular: Normal rate.   Pulmonary/Chest: Effort normal and breath sounds normal.  Abdominal: Soft. She exhibits no distension.  Musculoskeletal: Normal range of motion.  Neurological: She is alert and oriented to person, place, and time.  Skin: Skin is warm.  Psychiatric: She has a normal mood and affect.    ED Course  Procedures (including critical care time) Labs Review Labs Reviewed - No data to display Imaging Review No results found.  EKG Interpretation     Ventricular Rate:  PR Interval:    QRS Duration:   QT Interval:    QTC Calculation:   R Axis:     Text Interpretation:              MDM   1. Vomiting   2. Diarrhea      rx phenergan/   immodium for diarrhea.   Return if any problems.   Lonia Skinner Bryceland, PA-C 10/31/13 [phone]

## 2014-05-29 ENCOUNTER — Emergency Department (HOSPITAL_BASED_OUTPATIENT_CLINIC_OR_DEPARTMENT_OTHER)
Admission: EM | Admit: 2014-05-29 | Discharge: 2014-05-30 | Disposition: A | Discharge status: Home / Self Care | Payer: 59 | Attending: Emergency Medicine | Admitting: Emergency Medicine

## 2014-05-29 ENCOUNTER — Encounter (HOSPITAL_BASED_OUTPATIENT_CLINIC_OR_DEPARTMENT_OTHER): Payer: Self-pay | Admitting: Emergency Medicine

## 2014-05-29 DIAGNOSIS — Z9889 Other specified postprocedural states: Secondary | ICD-10-CM | POA: Insufficient documentation

## 2014-05-29 DIAGNOSIS — Z8719 Personal history of other diseases of the digestive system: Secondary | ICD-10-CM | POA: Insufficient documentation

## 2014-05-29 DIAGNOSIS — N83209 Unspecified ovarian cyst, unspecified side: Secondary | ICD-10-CM | POA: Insufficient documentation

## 2014-05-29 DIAGNOSIS — N83202 Unspecified ovarian cyst, left side: Secondary | ICD-10-CM

## 2014-05-29 DIAGNOSIS — Z79899 Other long term (current) drug therapy: Secondary | ICD-10-CM | POA: Insufficient documentation

## 2014-05-29 DIAGNOSIS — Z792 Long term (current) use of antibiotics: Secondary | ICD-10-CM | POA: Insufficient documentation

## 2014-05-29 DIAGNOSIS — Z3202 Encounter for pregnancy test, result negative: Secondary | ICD-10-CM | POA: Insufficient documentation

## 2014-05-29 DIAGNOSIS — Z9089 Acquired absence of other organs: Secondary | ICD-10-CM | POA: Insufficient documentation

## 2014-05-29 NOTE — ED Notes (Signed)
Onset of right lower quadrant pain two weeks ago.  Seen at her PMD and told she was constipated.  Pain has worsened and is now localized over her umbilical area radiating into her back.  C/o headaches, fatigue also with this pain.

## 2014-05-30 ENCOUNTER — Emergency Department (HOSPITAL_BASED_OUTPATIENT_CLINIC_OR_DEPARTMENT_OTHER): Payer: 59

## 2014-05-30 LAB — URINALYSIS, ROUTINE W REFLEX MICROSCOPIC
Bilirubin Urine: NEGATIVE
GLUCOSE, UA: NEGATIVE mg/dL
HGB URINE DIPSTICK: NEGATIVE
KETONES UR: NEGATIVE mg/dL
Nitrite: NEGATIVE
PROTEIN: NEGATIVE mg/dL
Specific Gravity, Urine: 1.022 (ref 1.005–1.030)
Urobilinogen, UA: 0.2 mg/dL (ref 0.0–1.0)
pH: 7.5 (ref 5.0–8.0)

## 2014-05-30 LAB — CBC WITH DIFFERENTIAL/PLATELET
Basophils Absolute: 0 10*3/uL (ref 0.0–0.1)
Basophils Relative: 0 % (ref 0–1)
Eosinophils Absolute: 0.1 10*3/uL (ref 0.0–0.7)
Eosinophils Relative: 1 % (ref 0–5)
HCT: 37.6 % (ref 36.0–46.0)
Hemoglobin: 13 g/dL (ref 12.0–15.0)
Lymphocytes Relative: 39 % (ref 12–46)
Lymphs Abs: 3.7 10*3/uL (ref 0.7–4.0)
MCH: 30.2 pg (ref 26.0–34.0)
MCHC: 34.6 g/dL (ref 30.0–36.0)
MCV: 87.2 fL (ref 78.0–100.0)
Monocytes Absolute: 0.7 10*3/uL (ref 0.1–1.0)
Monocytes Relative: 8 % (ref 3–12)
Neutro Abs: 5 10*3/uL (ref 1.7–7.7)
Neutrophils Relative %: 52 % (ref 43–77)
Platelets: 259 10*3/uL (ref 150–400)
RBC: 4.31 MIL/uL (ref 3.87–5.11)
RDW: 12.3 % (ref 11.5–15.5)
WBC: 9.7 10*3/uL (ref 4.0–10.5)

## 2014-05-30 LAB — LIPASE, BLOOD: Lipase: 34 U/L (ref 11–59)

## 2014-05-30 LAB — COMPREHENSIVE METABOLIC PANEL
ALK PHOS: 69 U/L (ref 39–117)
ALT: 10 U/L (ref 0–35)
AST: 17 U/L (ref 0–37)
Albumin: 4 g/dL (ref 3.5–5.2)
BILIRUBIN TOTAL: 0.3 mg/dL (ref 0.3–1.2)
BUN: 13 mg/dL (ref 6–23)
CALCIUM: 9.8 mg/dL (ref 8.4–10.5)
CO2: 25 meq/L (ref 19–32)
Chloride: 105 mEq/L (ref 96–112)
Creatinine, Ser: 0.7 mg/dL (ref 0.50–1.10)
GLUCOSE: 101 mg/dL — AB (ref 70–99)
Potassium: 4.4 mEq/L (ref 3.7–5.3)
SODIUM: 141 meq/L (ref 137–147)
Total Protein: 7.2 g/dL (ref 6.0–8.3)

## 2014-05-30 LAB — URINE MICROSCOPIC-ADD ON

## 2014-05-30 LAB — PREGNANCY, URINE: PREG TEST UR: NEGATIVE

## 2014-05-30 LAB — GC/CHLAMYDIA PROBE AMP
CT Probe RNA: NEGATIVE
GC Probe RNA: NEGATIVE

## 2014-05-30 LAB — WET PREP, GENITAL
Trich, Wet Prep: NONE SEEN
Yeast Wet Prep HPF POC: NONE SEEN

## 2014-05-30 MED ORDER — IOHEXOL 300 MG/ML  SOLN
100.0000 mL | Freq: Once | INTRAMUSCULAR | Status: AC | PRN
  Administered 2014-05-30: 100 mL via INTRAVENOUS

## 2014-05-30 MED ORDER — HYDROCODONE-ACETAMINOPHEN 5-325 MG PO TABS: 1.0000 | ORAL_TABLET | Freq: Four times a day (QID) | ORAL | Status: DC | PRN

## 2014-05-30 MED ORDER — ONDANSETRON HCL 4 MG/2ML IJ SOLN
4.0000 mg | Freq: Once | INTRAMUSCULAR | Status: AC
  Administered 2014-05-30: 4 mg via INTRAVENOUS
  Filled 2014-05-30: qty 2

## 2014-05-30 MED ORDER — FENTANYL CITRATE 0.05 MG/ML IJ SOLN
100.0000 ug | Freq: Once | INTRAMUSCULAR | Status: AC
  Administered 2014-05-30: 100 ug via INTRAVENOUS
  Filled 2014-05-30: qty 2

## 2014-05-30 MED ORDER — ONDANSETRON 8 MG PO TBDP: 8.0000 mg | ORAL_TABLET | Freq: Three times a day (TID) | ORAL | Status: DC | PRN

## 2014-05-30 MED ORDER — IOHEXOL 300 MG/ML  SOLN: 50.0000 mL | Freq: Once | INTRAMUSCULAR | Status: DC | PRN

## 2014-05-30 MED ORDER — SODIUM CHLORIDE 0.9 % IV SOLN
INTRAVENOUS | Status: DC
  Administered 2014-05-30: 02:00:00 via INTRAVENOUS

## 2014-05-30 NOTE — Discharge Instructions (Signed)

## 2014-05-30 NOTE — ED Provider Notes (Signed)
CSN: [id number]     Arrival date & time 05/29/14  2336 History   First MD Initiated Contact with Patient 05/30/14 0112     Chief Complaint  Patient presents with  . Abdominal Pain     (Consider location/radiation/quality/duration/timing/severity/associated sxs/prior Treatment) HPI This is a 23 year old female with a two-week history of abdominal pain. The pain was initially in her right lower quadrant but is migrated to her periumbilical region. The pain is dull at rest but becomes sharp with movement or palpation. It radiates to the left upper quadrant and into her back. She has had a headache and weakness as well as occasional nausea but no vomiting, diarrhea, constipation, fever, chills, dysuria, vaginal bleeding or vaginal discharge. The pain is moderate to severe at its worst, exacerbated by movement or position. It is impeding her activities of daily living. She is scheduled to see her OB/GYN on the 16th of this month but the severity of her symptoms prompted her to come to the ED. She denies any change with eating.  Past Medical History  Diagnosis Date  . PONV (postoperative nausea and vomiting)   . GERD (gastroesophageal reflux disease)   . Pregnancy related carpal tunnel syndrome, antepartum    Past Surgical History  Procedure Laterality Date  . Wisdom tooth extraction    . Cesarean section    . Cholecystectomy  2011  . Cesarean section N/A 02/10/2013    Procedure: Repeat cesarean section with delivery of baby girl at 1328. Apgars 9/9.;  Surgeon: Meriel Pica, MD;  Location: [facility];  Service: Obstetrics;  Laterality: N/A;  Repeat edc 02/17/13   Family History  Problem Relation Age of Onset  . Other Neg Hx   . Diabetes Father    History  Substance Use Topics  . Smoking status: Never Smoker   . Smokeless tobacco: Not on file  . Alcohol Use: No   OB History   Grav Para Term Preterm Abortions TAB SAB Ect Mult Living   2 2 2  0 0 0 0 0 0 1     Review of Systems  All  other systems reviewed and are negative.  Allergies  Morphine and related and Unasyn  Home Medications   Prior to Admission medications   Medication Sig Start Date End Date Taking? Authorizing Provider  acetaminophen (TYLENOL) 500 MG tablet Take 500 mg by mouth every 6 (six) hours as needed for pain.    Historical Provider, MD  clindamycin (CLEOCIN) 300 MG capsule Take 1 capsule (300 mg total) by mouth 3 (three) times daily. 04/22/13   Lyanne Co, MD  levonorgestrel (MIRENA) 20 MCG/24HR IUD 1 each by Intrauterine route once.    Historical Provider, MD  promethazine (PHENERGAN) 25 MG tablet Take 1 tablet (25 mg total) by mouth every 6 (six) hours as needed for nausea or vomiting. 10/31/13   Elson Areas, PA-C   BP 122/77  Pulse 56  Temp(Src) 98 F (36.7 C) (Oral)  Resp 18  Ht 5\' 4"  (1.626 m)  Wt 207 lb (93.895 kg)  BMI 35.51 kg/m2  SpO2 100%  Physical Exam General: Well-developed, well-nourished female in no acute distress; appearance consistent with age of record HENT: normocephalic; atraumatic Eyes: pupils equal, round and reactive to light; extraocular muscles intact Neck: supple Heart: regular rate and rhythm; no murmurs, rubs or gallops Lungs: clear to auscultation bilaterally Abdomen: soft; nondistended; supraumbilical and left upper quadrant tenderness; no masses or hepatosplenomegaly; bowel sounds present GU: Normal external genitalia;  white vaginal discharge; no vaginal bleeding; no cervical erythema; IUD strings seen in cervical os; no cervical motion tenderness; no adnexal tenderness Extremities: No deformity; full range of motion; pulses normal Neurologic: Awake, alert and oriented; motor function intact in all extremities and symmetric; no facial droop Skin: Warm and dry Psychiatric: Normal mood and affect    ED Course  Procedures (including critical care time)  MDM   Nursing notes and vitals signs, including pulse oximetry, reviewed.  Summary of this  visit's results, reviewed by myself:  Labs:  Results for orders placed during the hospital encounter of 05/29/14 (from the past 24 hour(s))  URINALYSIS, ROUTINE W REFLEX MICROSCOPIC     Status: Abnormal   Collection Time    05/30/14 12:14 AM      Result Value Ref Range   Color, Urine YELLOW  YELLOW   APPearance CLOUDY (*) CLEAR   Specific Gravity, Urine 1.022  1.005 - 1.030   pH 7.5  5.0 - 8.0   Glucose, UA NEGATIVE  NEGATIVE mg/dL   Hgb urine dipstick NEGATIVE  NEGATIVE   Bilirubin Urine NEGATIVE  NEGATIVE   Ketones, ur NEGATIVE  NEGATIVE mg/dL   Protein, ur NEGATIVE  NEGATIVE mg/dL   Urobilinogen, UA 0.2  0.0 - 1.0 mg/dL   Nitrite NEGATIVE  NEGATIVE   Leukocytes, UA TRACE (*) NEGATIVE  PREGNANCY, URINE     Status: None   Collection Time    05/30/14 12:14 AM      Result Value Ref Range   Preg Test, Ur NEGATIVE  NEGATIVE  URINE MICROSCOPIC-ADD ON     Status: Abnormal   Collection Time    05/30/14 12:14 AM      Result Value Ref Range   Squamous Epithelial / LPF RARE  RARE   WBC, UA 0-2  <3 WBC/hpf   RBC / HPF 0-2  <3 RBC/hpf   Bacteria, UA FEW (*) RARE  WET PREP, GENITAL     Status: Abnormal   Collection Time    05/30/14  1:56 AM      Result Value Ref Range   Yeast Wet Prep HPF POC NONE SEEN  NONE SEEN   Trich, Wet Prep NONE SEEN  NONE SEEN   Clue Cells Wet Prep HPF POC FEW (*) NONE SEEN   WBC, Wet Prep HPF POC MODERATE (*) NONE SEEN  COMPREHENSIVE METABOLIC PANEL     Status: Abnormal   Collection Time    05/30/14  2:15 AM      Result Value Ref Range   Sodium 141  137 - 147 mEq/L   Potassium 4.4  3.7 - 5.3 mEq/L   Chloride 105  96 - 112 mEq/L   CO2 25  19 - 32 mEq/L   Glucose, Bld 101 (*) 70 - 99 mg/dL   BUN 13  6 - 23 mg/dL   Creatinine, Ser 1.61  0.50 - 1.10 mg/dL   Calcium 9.8  8.4 - 09.6 mg/dL   Total Protein 7.2  6.0 - 8.3 g/dL   Albumin 4.0  3.5 - 5.2 g/dL   AST 17  0 - 37 U/L   ALT 10  0 - 35 U/L   Alkaline Phosphatase 69  39 - 117 U/L   Total Bilirubin  0.3  0.3 - 1.2 mg/dL   GFR calc non Af Amer >90  >90 mL/min   GFR calc Af Amer >90  >90 mL/min  LIPASE, BLOOD     Status: None   Collection  Time    05/30/14  2:15 AM      Result Value Ref Range   Lipase 34  11 - 59 U/L  CBC WITH DIFFERENTIAL     Status: None   Collection Time    05/30/14  2:15 AM      Result Value Ref Range   WBC 9.7  4.0 - 10.5 K/uL   RBC 4.31  3.87 - 5.11 MIL/uL   Hemoglobin 13.0  12.0 - 15.0 g/dL   HCT 78.237.6  95.636.0 - 21.346.0 %   MCV 87.2  78.0 - 100.0 fL   MCH 30.2  26.0 - 34.0 pg   MCHC 34.6  30.0 - 36.0 g/dL   RDW 08.612.3  57.811.5 - 46.915.5 %   Platelets 259  150 - 400 K/uL   Neutrophils Relative % 52  43 - 77 %   Neutro Abs 5.0  1.7 - 7.7 K/uL   Lymphocytes Relative 39  12 - 46 %   Lymphs Abs 3.7  0.7 - 4.0 K/uL   Monocytes Relative 8  3 - 12 %   Monocytes Absolute 0.7  0.1 - 1.0 K/uL   Eosinophils Relative 1  0 - 5 %   Eosinophils Absolute 0.1  0.0 - 0.7 K/uL   Basophils Relative 0  0 - 1 %   Basophils Absolute 0.0  0.0 - 0.1 K/uL    Imaging Studies: Ct Abdomen Pelvis W Contrast  05/30/2014   CLINICAL DATA:  Right lower quadrant pain for 2 weeks. Headache and fatigue.  EXAM: CT ABDOMEN AND PELVIS WITH CONTRAST  TECHNIQUE: Multidetector CT imaging of the abdomen and pelvis was performed using the standard protocol following bolus administration of intravenous contrast.  CONTRAST:  100mL OMNIPAQUE IOHEXOL 300 MG/ML  SOLN  COMPARISON:  Ultrasound pelvis 02/20/2012.  FINDINGS: Mild dependent changes in the lung bases.  Surgical absence of the gallbladder. The liver, spleen, pancreas, adrenal glands, kidneys, abdominal aorta, inferior vena cava, and retroperitoneal lymph nodes are unremarkable. Small accessory spleen. The stomach, small bowel, and colon are unremarkable for given degree of distention. No free air or free fluid in the abdomen. Abdominal wall musculature appears intact.  Pelvis: The appendix is normal. Uterus is retroverted. Intrauterine device is in place. No  abnormal adnexal masses. Dominant left ovarian cysts measures 4 cm in maximal dimension. This is likely benign and no follow-up is necessary. No diverticulitis. No bladder wall thickening. No destructive bone lesions.  IMPRESSION: Presumed functional cyst in the left pelvis. Surgical absence of the gallbladder. No focal acute process demonstrated in the abdomen or pelvis.   Electronically Signed   By: Burman NievesWilliam  Stevens M.D.   On: 05/30/2014 03:46   4:03 AM Advise CT findings and need to followup with her OB/GYN as scheduled.     Hanley SeamenJohn L Sabatino Williard, MD 05/30/14 [phone]

## 2014-10-24 ENCOUNTER — Encounter (HOSPITAL_BASED_OUTPATIENT_CLINIC_OR_DEPARTMENT_OTHER): Payer: Self-pay | Admitting: Emergency Medicine

## 2015-08-15 ENCOUNTER — Other Ambulatory Visit (HOSPITAL_COMMUNITY)
Admission: RE | Admit: 2015-08-15 | Discharge: 2015-08-15 | Disposition: A | Discharge status: Home / Self Care | Payer: 59 | Source: Ambulatory Visit | Attending: Obstetrics and Gynecology | Admitting: Obstetrics and Gynecology

## 2015-08-15 ENCOUNTER — Other Ambulatory Visit: Payer: Self-pay | Admitting: Nurse Practitioner

## 2015-08-15 DIAGNOSIS — Z01419 Encounter for gynecological examination (general) (routine) without abnormal findings: Secondary | ICD-10-CM | POA: Insufficient documentation

## 2015-08-17 LAB — CYTOLOGY - PAP

## 2016-03-25 ENCOUNTER — Encounter (HOSPITAL_BASED_OUTPATIENT_CLINIC_OR_DEPARTMENT_OTHER): Payer: Self-pay | Admitting: Emergency Medicine

## 2016-03-25 ENCOUNTER — Emergency Department (HOSPITAL_BASED_OUTPATIENT_CLINIC_OR_DEPARTMENT_OTHER)
Admission: EM | Admit: 2016-03-25 | Discharge: 2016-03-25 | Disposition: A | Discharge status: Home / Self Care | Payer: 59 | Attending: Emergency Medicine | Admitting: Emergency Medicine

## 2016-03-25 DIAGNOSIS — J020 Streptococcal pharyngitis: Secondary | ICD-10-CM | POA: Diagnosis not present

## 2016-03-25 DIAGNOSIS — K219 Gastro-esophageal reflux disease without esophagitis: Secondary | ICD-10-CM | POA: Insufficient documentation

## 2016-03-25 DIAGNOSIS — H6091 Unspecified otitis externa, right ear: Secondary | ICD-10-CM

## 2016-03-25 DIAGNOSIS — Z792 Long term (current) use of antibiotics: Secondary | ICD-10-CM | POA: Diagnosis not present

## 2016-03-25 DIAGNOSIS — J029 Acute pharyngitis, unspecified: Secondary | ICD-10-CM | POA: Diagnosis present

## 2016-03-25 MED ORDER — AMOXICILLIN 500 MG PO CAPS: 500.0000 mg | ORAL_CAPSULE | Freq: Two times a day (BID) | ORAL | Status: DC

## 2016-03-25 MED ORDER — OFLOXACIN 0.3 % OT SOLN: 5.0000 [drp] | Freq: Every day | OTIC | Status: DC

## 2016-03-25 MED ORDER — TRAMADOL HCL 50 MG PO TABS: 50.0000 mg | ORAL_TABLET | Freq: Four times a day (QID) | ORAL | Status: DC | PRN

## 2016-03-25 NOTE — Discharge Instructions (Signed)
Otitis Externa Otitis externa is a bacterial or fungal infection of the outer ear canal. This is the area from the eardrum to the outside of the ear. Otitis externa is sometimes called "swimmer's ear." CAUSES  Possible causes of infection include:  Swimming in dirty water.  Moisture remaining in the ear after swimming or bathing.  Mild injury (trauma) to the ear.  Objects stuck in the ear (foreign body).  Cuts or scrapes (abrasions) on the outside of the ear. SIGNS AND SYMPTOMS  The first symptom of infection is often itching in the ear canal. Later signs and symptoms may include swelling and redness of the ear canal, ear pain, and yellowish-white fluid (pus) coming from the ear. The ear pain may be worse when pulling on the earlobe. DIAGNOSIS  Your health care provider will perform a physical exam. A sample of fluid may be taken from the ear and examined for bacteria or fungi. TREATMENT  Antibiotic ear drops are often given for 10 to 14 days. Treatment may also include pain medicine or corticosteroids to reduce itching and swelling. HOME CARE INSTRUCTIONS   Apply antibiotic ear drops to the ear canal as prescribed by your health care provider.  Take medicines only as directed by your health care provider.  If you have diabetes, follow any additional treatment instructions from your health care provider.  Keep all follow-up visits as directed by your health care provider. PREVENTION   Keep your ear dry. Use the corner of a towel to absorb water out of the ear canal after swimming or bathing.  Avoid scratching or putting objects inside your ear. This can damage the ear canal or remove the protective wax that lines the canal. This makes it easier for bacteria and fungi to grow.  Avoid swimming in lakes, polluted water, or poorly chlorinated pools.  You may use ear drops made of rubbing alcohol and vinegar after swimming. Combine equal parts of white vinegar and alcohol in a bottle.  Put 3 or 4 drops into each ear after swimming. SEEK MEDICAL CARE IF:   You have a fever.  Your ear is still red, swollen, painful, or draining pus after 3 days.  Your redness, swelling, or pain gets worse.  You have a severe headache.  You have redness, swelling, pain, or tenderness in the area behind your ear. MAKE SURE YOU:   Understand these instructions.  Will watch your condition.  Will get help right away if you are not doing well or get worse.   This information is not intended to replace advice given to you by your health care provider. Make sure you discuss any questions you have with your health care provider.   Document Released: 12/09/2005 Document Revised: 12/30/2014 Document Reviewed: 12/26/2011 Elsevier Interactive Patient Education 2016 [employer]  Pharyngitis Pharyngitis is redness, pain, and swelling (inflammation) of your pharynx.  CAUSES  Pharyngitis is usually caused by infection. Most of the time, these infections are from viruses (viral) and are part of a cold. However, sometimes pharyngitis is caused by bacteria (bacterial). Pharyngitis can also be caused by allergies. Viral pharyngitis may be spread from person to person by coughing, sneezing, and personal items or utensils (cups, forks, spoons, toothbrushes). Bacterial pharyngitis may be spread from person to person by more intimate contact, such as kissing.  SIGNS AND SYMPTOMS  Symptoms of pharyngitis include:   Sore throat.   Tiredness (fatigue).   Low-grade fever.   Headache.  Joint pain and muscle aches.  Skin  rashes.  Swollen lymph nodes.  Plaque-like film on throat or tonsils (often seen with bacterial pharyngitis). DIAGNOSIS  Your health care provider will ask you questions about your illness and your symptoms. Your medical history, along with a physical exam, is often all that is needed to diagnose pharyngitis. Sometimes, a rapid strep test is done. Other lab tests may also be  done, depending on the suspected cause.  TREATMENT  Viral pharyngitis will usually get better in 3-4 days without the use of medicine. Bacterial pharyngitis is treated with medicines that kill germs (antibiotics).  HOME CARE INSTRUCTIONS   Drink enough water and fluids to keep your urine clear or pale yellow.   Only take over-the-counter or prescription medicines as directed by your health care provider:   If you are prescribed antibiotics, make sure you finish them even if you start to feel better.   Do not take aspirin.   Get lots of rest.   Gargle with 8 oz of salt water ( tsp of salt per 1 qt of water) as often as every 1-2 hours to soothe your throat.   Throat lozenges (if you are not at risk for choking) or sprays may be used to soothe your throat. SEEK MEDICAL CARE IF:   You have large, tender lumps in your neck.  You have a rash.  You cough up green, yellow-brown, or bloody spit. SEEK IMMEDIATE MEDICAL CARE IF:   Your neck becomes stiff.  You drool or are unable to swallow liquids.  You vomit or are unable to keep medicines or liquids down.  You have severe pain that does not go away with the use of recommended medicines.  You have trouble breathing (not caused by a stuffy nose). MAKE SURE YOU:   Understand these instructions.  Will watch your condition.  Will get help right away if you are not doing well or get worse.   This information is not intended to replace advice given to you by your health care provider. Make sure you discuss any questions you have with your health care provider.   Discontinue taking your Z-Pak. Take amoxicillin as prescribed. Use eardrops daily. Follow up with ENT for further evaluation. Return to the ED if you experience severe worsening or symptoms, pain in the bone behind her ear, neck pain or stiffness, difficulty breathing or swallowing.

## 2016-03-25 NOTE — ED Notes (Addendum)
Patient states that she was seen and treated yesterday for a sore throat. The patient reports that she is now having pain to her right ear and a fullness

## 2016-03-25 NOTE — ED Provider Notes (Signed)
CSN: [id number]     Arrival date & time 03/25/16  2153 History   First MD Initiated Contact with Patient 03/25/16 2301     Chief Complaint  Patient presents with  . Sore Throat     (Consider location/radiation/quality/duration/timing/severity/associated sxs/prior Treatment) HPI  Whitney Wilson is a 25 y.o F with no significant pmhx who presents to the ED today c/o sore throat and otalgia. Pt states that yesterday she woke up with a sore throat and had mild right sided ear pain with associated rhinorrhea. Pt states that she Used a tele-doc at her work to be evaluated for these symptoms And was prescribed a Z-Pak and eardrops. Patient states today that her right ear pain got significantly worse and she heard a pop and subsequently heard a "rolling thunder" sound in her ear. Patient is also having right ear drainage and high fevers for which she has been taking tylenol for. Pt states that she has had multiple ear infection in the last 3 months and has been on prednisone and different abx each time. Denies cough, SOB, neck pain/stiffness, vomiting, SOB, HA, hearing loss.    Past Medical History  Diagnosis Date  . PONV (postoperative nausea and vomiting)   . GERD (gastroesophageal reflux disease)   . Pregnancy related carpal tunnel syndrome, antepartum    Past Surgical History  Procedure Laterality Date  . Wisdom tooth extraction    . Cesarean section    . Cholecystectomy  2011  . Cesarean section N/A 02/10/2013    Procedure: Repeat cesarean section with delivery of baby girl at 1328. Apgars 9/9.;  Surgeon: Meriel Picaichard M Holland, MD;  Location: [facility];  Service: Obstetrics;  Laterality: N/A;  Repeat edc 02/17/13   Family History  Problem Relation Age of Onset  . Other Neg Hx   . Diabetes Father    Social History  Substance Use Topics  . Smoking status: Never Smoker   . Smokeless tobacco: None  . Alcohol Use: No   OB History    Gravida Para Term Preterm AB TAB SAB Ectopic Multiple  Living   2 2 2  0 0 0 0 0 0 1     Review of Systems  All other systems reviewed and are negative.     Allergies  Morphine and related and Unasyn  Home Medications   Prior to Admission medications   Medication Sig Start Date End Date Taking? Authorizing Provider  acetaminophen (TYLENOL) 500 MG tablet Take 500 mg by mouth every 6 (six) hours as needed for pain.    Historical Provider, MD  amoxicillin (AMOXIL) 500 MG capsule Take 1 capsule (500 mg total) by mouth 2 (two) times daily. 03/25/16   Samantha Tripp Dowless, PA-C  clindamycin (CLEOCIN) 300 MG capsule Take 1 capsule (300 mg total) by mouth 3 (three) times daily. 04/22/13   Azalia BilisKevin Campos, MD  HYDROcodone-acetaminophen (NORCO/VICODIN) 5-325 MG per tablet Take 1-2 tablets by mouth every 6 (six) hours as needed for moderate pain. 05/30/14   Paula LibraJohn Molpus, MD  levonorgestrel (MIRENA) 20 MCG/24HR IUD 1 each by Intrauterine route once.    Historical Provider, MD  ofloxacin (FLOXIN) 0.3 % otic solution Place 5 drops into the right ear daily. 03/25/16   Samantha Tripp Dowless, PA-C  ondansetron (ZOFRAN ODT) 8 MG disintegrating tablet Take 1 tablet (8 mg total) by mouth every 8 (eight) hours as needed for nausea or vomiting. 05/30/14   John Molpus, MD  promethazine (PHENERGAN) 25 MG tablet Take 1 tablet (25  mg total) by mouth every 6 (six) hours as needed for nausea or vomiting. 10/31/13   Elson Areas, PA-C   BP 137/97 mmHg  Pulse 78  Temp(Src) 97.8 F (36.6 C) (Oral)  Resp 18  Ht 5' (1.524 m)  Wt 94.802 kg  BMI 40.82 kg/m2  SpO2 100% Physical Exam  Constitutional: She is oriented to person, place, and time. She appears well-developed and well-nourished. No distress.  HENT:  Head: Normocephalic and atraumatic.  Right Ear: There is drainage, swelling and tenderness. Tympanic membrane is erythematous.  Left Ear: Tympanic membrane normal.  Mouth/Throat: Uvula is midline and mucous membranes are normal. No trismus in the jaw. No uvula swelling.  Oropharyngeal exudate, posterior oropharyngeal edema and posterior oropharyngeal erythema present. No tonsillar abscesses.  Eyes: Conjunctivae and EOM are normal. Pupils are equal, round, and reactive to light. Right eye exhibits no discharge. Left eye exhibits no discharge. No scleral icterus.  Neck: Neck supple.  Cardiovascular: Normal rate.   Pulmonary/Chest: Effort normal and breath sounds normal.  Lymphadenopathy:    She has no cervical adenopathy.  Neurological: She is alert and oriented to person, place, and time. Coordination normal.  Skin: Skin is warm and dry. No rash noted. She is not diaphoretic. No erythema. No pallor.  Psychiatric: She has a normal mood and affect. Her behavior is normal.  Nursing note and vitals reviewed.   ED Course  Procedures (including critical care time) Labs Review Labs Reviewed - No data to display  Imaging Review No results found. I have personally reviewed and evaluated these images and lab results as part of my medical decision-making.   EKG Interpretation None      MDM   Final diagnoses:  Otitis externa, right  Strep pharyngitis    Otherwise healthy 25 y.o F presents to the ED with R otalgia and sore throat. 4/4 centor criteria present. Clinically pt appears to have strep pharyngitis. R ear canal is edematous and with purulent drainage. No canal occlusion. No ear wick. Will treat with amoxicillin for strep and Ofloxacin for otitis externa. Pt will d/c home azithromycin. Recommend follow up with ENT as pt has had recurrent ear infections. Return precautions outlined in patient discharge instructions.      Lester Kinsman [place], PA-C 03/26/16 6578  Shon Baton, MD 03/26/16 [phone]

## 2016-03-26 ENCOUNTER — Telehealth: Payer: Self-pay | Admitting: *Deleted

## 2016-03-26 NOTE — ED Notes (Signed)
Contacted by [facility], Floxin 0.3% otic solution no longer available, OK to change to opthalmic solution

## 2017-07-21 ENCOUNTER — Ambulatory Visit (INDEPENDENT_AMBULATORY_CARE_PROVIDER_SITE_OTHER): Payer: 59

## 2017-07-21 ENCOUNTER — Ambulatory Visit (INDEPENDENT_AMBULATORY_CARE_PROVIDER_SITE_OTHER): Payer: 59 | Admitting: Orthopaedic Surgery

## 2017-07-21 ENCOUNTER — Encounter (INDEPENDENT_AMBULATORY_CARE_PROVIDER_SITE_OTHER): Payer: Self-pay | Admitting: Orthopaedic Surgery

## 2017-07-21 DIAGNOSIS — M25552 Pain in left hip: Secondary | ICD-10-CM

## 2017-07-21 NOTE — Progress Notes (Signed)
Office Visit Note   Patient: Whitney Wilson           Date of Birth: 02/21/1991           MRN: [id number] Visit Date: 07/21/2017              Requested by: No referring provider defined for this encounter. PCP: Patient, No Pcp Per   Assessment & Plan: Visit Diagnoses:  1. Pain in left hip     Plan: Overall impression is left hip pain possible labral tear. MR arthrogram order to rule this out. Follow-up after the MRI  Follow-Up Instructions: Return in about 2 weeks (around 08/04/2017).   Orders:  Orders Placed This Encounter  Procedures  . XR HIP UNILAT W OR W/O PELVIS 2-3 VIEWS LEFT  . MR HIP LEFT WO CONTRAST   No orders of the defined types were placed in this encounter.     Procedures: No procedures performed   Clinical Data: No additional findings.   Subjective: Chief Complaint  Patient presents with  . Left Hip - Pain    Whitney Wilson is a healthy 26 year old female comes in with left hip pain for 2 years. She originally saw someone [facility] who diagnosed her with left trochanteric bursitis and advised her to lose weight. She also did physical therapy. She has done physical therapy and has lost significant amount of weight but he needs to have pain. She is not able to go to the gym. She endorses pain deep down in her left hip that occasionally radiates down her leg. She does have some low back pain chronically. She denies any groin pain. Denies any numbness and tingling.    Review of Systems  Constitutional: Negative.   HENT: Negative.   Eyes: Negative.   Respiratory: Negative.   Cardiovascular: Negative.   Endocrine: Negative.   Musculoskeletal: Negative.   Neurological: Negative.   Hematological: Negative.   Psychiatric/Behavioral: Negative.   All other systems reviewed and are negative.    Objective: Vital Signs: There were no vitals taken for this visit.  Physical Exam  Constitutional: She is oriented to person, place, and time.  She appears well-developed and well-nourished.  HENT:  Head: Normocephalic and atraumatic.  Eyes: EOM are normal.  Neck: Neck supple.  Pulmonary/Chest: Effort normal.  Abdominal: Soft.  Neurological: She is alert and oriented to person, place, and time.  Skin: Skin is warm. Capillary refill takes less than 2 seconds.  Psychiatric: She has a normal mood and affect. Her behavior is normal. Judgment and thought content normal.  Nursing note and vitals reviewed.   Ortho Exam Left hip exam shows pain with internal rotation and flexion and external rotation of the hip. Positive Stinchfield sign. Trochanteric bursa is nontender. SI joint is nontender. Faber's negative. Specialty Comments:  No specialty comments available.  Imaging: Xr Hip Unilat W Or W/o Pelvis 2-3 Views Left  Result Date: 07/21/2017 No structural or acute modalities    PMFS History: Patient Active Problem List   Diagnosis Date Noted  . Constipation 11/14/2011  . Dental caries 11/12/2011  . Cellulitis diffuse, face 11/10/2011   Past Medical History:  Diagnosis Date  . GERD (gastroesophageal reflux disease)   . PONV (postoperative nausea and vomiting)   . Pregnancy related carpal tunnel syndrome, antepartum     Family History  Problem Relation Age of Onset  . Other Neg Hx   . Diabetes Father     Past Surgical History:  Procedure Laterality Date  .  CESAREAN SECTION    . CESAREAN SECTION N/A 02/10/2013   Procedure: Repeat cesarean section with delivery of baby girl at 1328. Apgars 9/9.;  Surgeon: Meriel Picaichard M Holland, MD;  Location: [facility];  Service: Obstetrics;  Laterality: N/A;  Repeat edc 02/17/13  . CHOLECYSTECTOMY  2011  . WISDOM TOOTH EXTRACTION     Social History   Occupational History  . Not on file.   Social History Main Topics  . Smoking status: Never Smoker  . Smokeless tobacco: Never Used  . Alcohol use No  . Drug use: No  . Sexual activity: Yes    Birth control/ protection: IUD      Comment: pregnant

## 2017-07-30 ENCOUNTER — Other Ambulatory Visit (INDEPENDENT_AMBULATORY_CARE_PROVIDER_SITE_OTHER): Payer: Self-pay | Admitting: Orthopaedic Surgery

## 2017-07-30 ENCOUNTER — Telehealth (INDEPENDENT_AMBULATORY_CARE_PROVIDER_SITE_OTHER): Payer: Self-pay | Admitting: Orthopaedic Surgery

## 2017-07-30 DIAGNOSIS — M25552 Pain in left hip: Secondary | ICD-10-CM

## 2017-07-30 NOTE — Telephone Encounter (Signed)
Patient called wanting to know if there was anything she could do at night to help her sleep, since she normally sleeps on her left side. CB # [phone]

## 2017-07-31 MED ORDER — TRAMADOL HCL 50 MG PO TABS: 50.0000 mg | ORAL_TABLET | Freq: Every evening | ORAL | 0 refills | Status: DC | PRN

## 2017-07-31 NOTE — Telephone Encounter (Signed)
See message below °

## 2017-07-31 NOTE — Telephone Encounter (Signed)
Called into pharm  

## 2017-07-31 NOTE — Telephone Encounter (Signed)
Called into pharm pt aware

## 2017-07-31 NOTE — Addendum Note (Signed)
Addended by: Albertina ParrGARCIA, Shequila Neglia on: 07/31/2017 11:53 AM   Modules accepted: Orders

## 2017-07-31 NOTE — Telephone Encounter (Signed)
Tramadol 50  mg qhs prn #20

## 2017-08-01 ENCOUNTER — Other Ambulatory Visit: Payer: 59

## 2017-08-11 ENCOUNTER — Ambulatory Visit (INDEPENDENT_AMBULATORY_CARE_PROVIDER_SITE_OTHER): Payer: 59 | Admitting: Orthopaedic Surgery

## 2017-08-13 ENCOUNTER — Ambulatory Visit
Admission: RE | Admit: 2017-08-13 | Discharge: 2017-08-13 | Disposition: A | Discharge status: Home / Self Care | Payer: 59 | Source: Ambulatory Visit | Attending: Orthopaedic Surgery | Admitting: Orthopaedic Surgery

## 2017-08-13 DIAGNOSIS — M25552 Pain in left hip: Secondary | ICD-10-CM

## 2017-08-13 MED ORDER — IOPAMIDOL (ISOVUE-M 200) INJECTION 41%
15.0000 mL | Freq: Once | INTRAMUSCULAR | Status: AC
  Administered 2017-08-13: 15 mL via INTRA_ARTICULAR

## 2017-08-14 ENCOUNTER — Ambulatory Visit (INDEPENDENT_AMBULATORY_CARE_PROVIDER_SITE_OTHER): Payer: 59 | Admitting: Orthopaedic Surgery

## 2017-08-14 ENCOUNTER — Encounter (INDEPENDENT_AMBULATORY_CARE_PROVIDER_SITE_OTHER): Payer: Self-pay | Admitting: Orthopaedic Surgery

## 2017-08-14 DIAGNOSIS — M25552 Pain in left hip: Secondary | ICD-10-CM

## 2017-08-14 NOTE — Progress Notes (Signed)
Office Visit Note   Patient: Whitney Wilson           Date of Birth: [birth year]           MRN: [id number] Visit Date: 08/14/2017              Requested by: No referring provider defined for this encounter. PCP: Patient, No Pcp Per   Assessment & Plan: Visit Diagnoses:  1. Pain in left hip     Plan: MR arthrogram is negative for intra-articular pathology. She has a small amount of edema in her bilateral trochanteric bursitis. I recommend cortisone injection to the left trochanteric bursa. Patient will like to follow-up at a later time for the injection.  Follow-Up Instructions: Return if symptoms worsen or fail to improve.   Orders:  No orders of the defined types were placed in this encounter.  No orders of the defined types were placed in this encounter.     Procedures: No procedures performed   Clinical Data: No additional findings.   Subjective: Chief Complaint  Patient presents with  . Left Hip - Pain, Follow-up    Whitney Wilson follows up today for her left hip pain. She states the pain is worse with lying down on her left side. Denies any radicular symptoms.    Review of Systems   Objective: Vital Signs: There were no vitals taken for this visit.  Physical Exam  Ortho Exam Left hip exam is stable. She does not have any exquisite tenderness over the trochanteric bursa. Specialty Comments:  No specialty comments available.  Imaging: Mr Hip Left W/ Contrast  Result Date: 08/13/2017 CLINICAL DATA:  Two years of left hip pain and discomfort with weakness. No known injury or surgery. EXAM: MRI OF THE LEFT HIP WITH CONTRAST TECHNIQUE: Multiplanar, multisequence MR imaging was performed following the administration of intravenous contrast. CONTRAST:  1:200 dilution of MultiHance in Isovue 200 administered into the left hip joint, performed under fluoroscopic guidance under separate procedure. COMPARISON:  None. FINDINGS: Bones: No marrow signal  abnormalities. No fracture dislocations of either hip. Normal morphology of the femoral heads without flattening. No evidence of femoroacetabular impingement. Articular cartilage and labrum Articular cartilage:  Intact Labrum:  Intact Joint or bursal effusion Joint effusion: Joint distention of the left hip with contrast. No loose bodies noted about either hip. Bursae: Trace bursal edema adjacent to the greater trochanters bilaterally. Muscles and tendons Muscles and tendons:  Negative Other findings Miscellaneous: Intrauterine device noted within the endometrial cavity. Physiologic sized follicles noted of both ovaries without adnexal masses. Nabothian cysts at the cervix. The urinary bladder is physiologically distended and unremarkable. IMPRESSION: 1. Trace bursal edema along the greater trochanters bilaterally which may reflect mild greater trochanteric bursitis. 2. No focal chondral defect, marrow edema or labral tear is noted. Electronically Signed   By: Tollie Eth M.D.   On: 08/13/2017 20:10   Dg Fluoro Guided Needle Plc Aspiration/injection Loc  Result Date: 08/13/2017 CLINICAL DATA:  LEFT hip pain. EXAM: LEFT HIP INJECTION FOR MRI FLUOROSCOPY TIME:  7 seconds corresponding to a Dose Area Product of 26.21 Gy*m2 PROCEDURE: Informed written consent was obtained.  Time-out was performed. Overlying skin prepped with Betadine, draped in the usual sterile fashion, and infiltrated locally with Lidocaine. Curved 22 gauge spinal needle advanced to the superolateral margin of the LEFT femoral head. 1 ml of Lidocaine injected easily. A mixture of 0.1 ml MultiHance in 20 mL of dilute Isovue M 200 was then used  to opacify the LEFT hip joint. No immediate complication. IMPRESSION: Technically successful LEFT hip injection under fluoroscopy for MR arthrogram. Electronically Signed   By: Elsie Stain M.D.   On: 08/13/2017 16:22     PMFS History: Patient Active Problem List   Diagnosis Date Noted  .  Constipation 11/14/2011  . Dental caries 11/12/2011  . Cellulitis diffuse, face 11/10/2011   Past Medical History:  Diagnosis Date  . GERD (gastroesophageal reflux disease)   . PONV (postoperative nausea and vomiting)   . Pregnancy related carpal tunnel syndrome, antepartum     Family History  Problem Relation Age of Onset  . Other Neg Hx   . Diabetes Father     Past Surgical History:  Procedure Laterality Date  . CESAREAN SECTION    . CESAREAN SECTION N/A 02/10/2013   Procedure: Repeat cesarean section with delivery of baby girl at 1328. Apgars 9/9.;  Surgeon: Meriel Pica, MD;  Location: [facility];  Service: Obstetrics;  Laterality: N/A;  Repeat edc 02/17/13  . CHOLECYSTECTOMY  2011  . WISDOM TOOTH EXTRACTION     Social History   Occupational History  . Not on file.   Social History Main Topics  . Smoking status: Never Smoker  . Smokeless tobacco: Never Used  . Alcohol use No  . Drug use: No  . Sexual activity: Yes    Birth control/ protection: IUD     Comment: pregnant

## 2018-11-05 LAB — OB RESULTS CONSOLE ABO/RH: RH Type: POSITIVE

## 2018-11-05 LAB — OB RESULTS CONSOLE RPR: RPR: NONREACTIVE

## 2018-11-05 LAB — OB RESULTS CONSOLE RUBELLA ANTIBODY, IGM: Rubella: IMMUNE

## 2018-11-05 LAB — OB RESULTS CONSOLE HIV ANTIBODY (ROUTINE TESTING): HIV: NONREACTIVE

## 2018-11-05 LAB — OB RESULTS CONSOLE ANTIBODY SCREEN: Antibody Screen: NEGATIVE

## 2018-11-05 LAB — OB RESULTS CONSOLE HEPATITIS B SURFACE ANTIGEN: Hepatitis B Surface Ag: NEGATIVE

## 2018-11-05 LAB — OB RESULTS CONSOLE GC/CHLAMYDIA
Chlamydia: NEGATIVE
Gonorrhea: NEGATIVE

## 2018-12-29 ENCOUNTER — Encounter (HOSPITAL_BASED_OUTPATIENT_CLINIC_OR_DEPARTMENT_OTHER): Payer: Self-pay | Admitting: Emergency Medicine

## 2018-12-29 ENCOUNTER — Emergency Department (HOSPITAL_BASED_OUTPATIENT_CLINIC_OR_DEPARTMENT_OTHER)
Admission: EM | Admit: 2018-12-29 | Discharge: 2018-12-29 | Disposition: A | Discharge status: Home / Self Care | Payer: 59 | Attending: Emergency Medicine | Admitting: Emergency Medicine

## 2018-12-29 ENCOUNTER — Other Ambulatory Visit: Payer: Self-pay

## 2018-12-29 ENCOUNTER — Emergency Department (HOSPITAL_BASED_OUTPATIENT_CLINIC_OR_DEPARTMENT_OTHER): Payer: 59

## 2018-12-29 DIAGNOSIS — J181 Lobar pneumonia, unspecified organism: Secondary | ICD-10-CM | POA: Diagnosis not present

## 2018-12-29 DIAGNOSIS — R05 Cough: Secondary | ICD-10-CM | POA: Diagnosis not present

## 2018-12-29 DIAGNOSIS — J189 Pneumonia, unspecified organism: Secondary | ICD-10-CM

## 2018-12-29 DIAGNOSIS — Z79899 Other long term (current) drug therapy: Secondary | ICD-10-CM | POA: Insufficient documentation

## 2018-12-29 DIAGNOSIS — R0981 Nasal congestion: Secondary | ICD-10-CM | POA: Diagnosis present

## 2018-12-29 MED ORDER — AMOXICILLIN 500 MG PO CAPS
1000.0000 mg | ORAL_CAPSULE | Freq: Once | ORAL | Status: AC
  Administered 2018-12-29: 1000 mg via ORAL
  Filled 2018-12-29: qty 2

## 2018-12-29 MED ORDER — AMOXICILLIN 500 MG PO CAPS: 1000.0000 mg | ORAL_CAPSULE | Freq: Three times a day (TID) | ORAL | 0 refills | Status: DC

## 2018-12-29 MED ORDER — OXYMETAZOLINE HCL 0.05 % NA SOLN
2.0000 | Freq: Two times a day (BID) | NASAL | Status: DC | PRN
  Administered 2018-12-29: 2 via NASAL
  Filled 2018-12-29: qty 15

## 2018-12-29 NOTE — ED Triage Notes (Signed)
Pt states she had bronchitis last month  Pt states she has congestion and cough  Pt was seen at urgent care Friday and was told she had viral infection and it would have to pass  Pt states since then she has been feeling worse and has had a fever and chills  Pt is [redacted] weeks pregnant

## 2018-12-29 NOTE — ED Provider Notes (Signed)
[facility] Provider Note: Lowella Dell, MD, FACEP  CSN: [id number] MRN: [id number] ARRIVAL: 12/29/18 at 0525 ROOM: [facility]05/[facility]   CHIEF COMPLAINT  URI   HISTORY OF PRESENT ILLNESS  12/29/18 5:39 AM Whitney Wilson is a 28 y.o. female who has had respiratory symptoms for several weeks.  Specifically she has had nasal congestion, cough and malaise.  She was seen at an urgent care 4 days ago and told she had a viral illness and was told to take Sudafed.  She took Sudafed for 3 days but stopped because it did not seem to help.  For the past 2 days she has had fever and chills with a productive cough.  She has been taking Tylenol and ibuprofen for the fever without adequate relief.   Past Medical History:  Diagnosis Date  . GERD (gastroesophageal reflux disease)   . PONV (postoperative nausea and vomiting)   . Pregnancy related carpal tunnel syndrome, antepartum     Past Surgical History:  Procedure Laterality Date  . CESAREAN SECTION    . CESAREAN SECTION N/A 02/10/2013   Procedure: Repeat cesarean section with delivery of baby girl at 1328. Apgars 9/9.;  Surgeon: Meriel Pica, MD;  Location: [facility];  Service: Obstetrics;  Laterality: N/A;  Repeat edc 02/17/13  . CHOLECYSTECTOMY  2011  . WISDOM TOOTH EXTRACTION      Family History  Problem Relation Age of Onset  . Diabetes Father   . Other Neg Hx     Social History   Tobacco Use  . Smoking status: Never Smoker  . Smokeless tobacco: Never Used  Substance Use Topics  . Alcohol use: No  . Drug use: No    Prior to Admission medications   Medication Sig Start Date End Date Taking? Authorizing Provider  acetaminophen (TYLENOL) 500 MG tablet Take 500 mg by mouth every 6 (six) hours as needed for pain.    [provider]  amoxicillin (AMOXIL) 500 MG capsule Take 1 capsule (500 mg total) by mouth 2 (two) times daily. Patient not taking: Reported on 07/21/2017 03/25/16   Dowless, Lelon Mast Tripp, PA-C    clindamycin (CLEOCIN) 300 MG capsule Take 1 capsule (300 mg total) by mouth 3 (three) times daily. Patient not taking: Reported on 07/21/2017 04/22/13   Azalia Bilis, MD  HYDROcodone-acetaminophen (NORCO/VICODIN) 5-325 MG per tablet Take 1-2 tablets by mouth every 6 (six) hours as needed for moderate pain. Patient not taking: Reported on 07/21/2017 05/30/14   Giah Fickett, MD  levonorgestrel (MIRENA) 20 MCG/24HR IUD 1 each by Intrauterine route once.    [provider]  ofloxacin (FLOXIN) 0.3 % otic solution Place 5 drops into the right ear daily. Patient not taking: Reported on 07/21/2017 03/25/16   Dowless, Lelon Mast Tripp, PA-C  ondansetron (ZOFRAN ODT) 8 MG disintegrating tablet Take 1 tablet (8 mg total) by mouth every 8 (eight) hours as needed for nausea or vomiting. Patient not taking: Reported on 07/21/2017 05/30/14   Lasonia Casino, Jonny Ruiz, MD  promethazine (PHENERGAN) 25 MG tablet Take 1 tablet (25 mg total) by mouth every 6 (six) hours as needed for nausea or vomiting. Patient not taking: Reported on 07/21/2017 10/31/13   Elson Areas, PA-C  traMADol (ULTRAM) 50 MG tablet Take 1 tablet (50 mg total) by mouth every 6 (six) hours as needed. Patient not taking: Reported on 07/21/2017 03/25/16   Dowless, Lelon Mast Tripp, PA-C  traMADol (ULTRAM) 50 MG tablet Take 1 tablet (50 mg total) by mouth at  bedtime as needed. 07/31/17   Tarry Kos, MD    Allergies Morphine and related and Unasyn [ampicillin-sulbactam sodium]   REVIEW OF SYSTEMS  Negative except as noted here or in the History of Present Illness.   PHYSICAL EXAMINATION  Initial Vital Signs Blood pressure 120/89, pulse (!) 106, temperature 98.4 F (36.9 C), temperature source Oral, resp. rate 18, height 5\' 4"  (1.626 m), weight 95.3 kg, last menstrual period 08/07/2018, SpO2 98 %.  Examination General: Well-developed, well-nourished female in no acute distress; appearance consistent with age of record HENT: normocephalic; atraumatic; TMs  normal; pharynx normal; nasal congestion Eyes: pupils equal, round and reactive to light; extraocular muscles intact Neck: supple Heart: regular rate and rhythm; tachycardia Lungs: clear to auscultation bilaterally except increased breath sounds in right brace Abdomen: soft; nondistended; nontender; no masses or hepatosplenomegaly; bowel sounds present Extremities: No deformity; full range of motion; pulses normal Neurologic: Awake, alert and oriented; motor function intact in all extremities and symmetric; no facial droop Skin: Warm and dry Psychiatric: Normal mood and affect   RESULTS  Summary of this visit's results, reviewed by myself:   EKG Interpretation  Date/Time:    Ventricular Rate:    PR Interval:    QRS Duration:   QT Interval:    QTC Calculation:   R Axis:     Text Interpretation:        Laboratory Studies: No results found for this or any previous visit (from the past 24 hour(s)). Imaging Studies: Dg Chest 2 View  Result Date: 12/29/2018 CLINICAL DATA:  Initial evaluation for acute cough, fever. EXAM: CHEST - 2 VIEW COMPARISON:  None. FINDINGS: Cardiac and mediastinal silhouettes are within normal limits. Lungs normally inflated. There is subtle hazy opacity at the posterior left lower lobe, somewhat suspicious for mild and/or early infiltrate given provided history. No edema or effusion. No pneumothorax. Osseous structures within normal limits. IMPRESSION: Subtle hazy opacity at the posterior left lower lobe, suspicious for possible early/developing infiltrate given provided history. Electronically Signed   By: Rise Mu M.D.   On: 12/29/2018 06:15    ED COURSE and MDM  Nursing notes and initial vitals signs, including pulse oximetry, reviewed.  Vitals:   12/29/18 0534 12/29/18 0536  BP: 120/89   Pulse: (!) 106   Resp: 18   Temp: 98.4 F (36.9 C)   TempSrc: Oral   SpO2: 98%   Weight:  95.3 kg  Height:  5\' 4"  (1.626 m)   Patient advised to  avoid Sudafed and ibuprofen during pregnancy.  She was told that Mucinex DM is safe. We will treat her with amoxicillin for community-acquired pneumonia.   PROCEDURES    ED DIAGNOSES     ICD-10-CM   1. Community acquired pneumonia of left lower lobe of lung (HCC) J18.1        Rony Ratz, MD 12/29/18 [phone]

## 2018-12-29 NOTE — ED Notes (Signed)
PT states understanding of care given, follow up care, and medication prescribed. PT ambulated from ED to car with a steady gait. 

## 2019-05-24 ENCOUNTER — Encounter (HOSPITAL_COMMUNITY): Payer: Self-pay | Admitting: *Deleted

## 2019-05-24 NOTE — Patient Instructions (Signed)
BETHZAIDA BARGIEL  05/24/2019   Your procedure is scheduled on:  05/27/2019  Arrive at 0500 at [facility] on [address] at [facility]  and [address]. You are invited to use the FREE valet parking or use the Visitor's parking deck.  Pick up the phone at the desk and dial [phone].  Call this number if you have problems the morning of surgery: [phone]  Remember:   Do not eat food:(After Midnight) Desps de medianoche.  Do not drink clear liquids: (After Midnight) Desps de medianoche.  Take these medicines the morning of surgery with A SIP OF WATER:  none   Do not wear jewelry, make-up or nail polish.  Do not wear lotions, powders, or perfumes. Do not wear deodorant.  Do not shave 48 hours prior to surgery.  Do not bring valuables to the hospital.  [facility] is not   responsible for any belongings or valuables brought to the hospital.  Contacts, dentures or bridgework may not be worn into surgery.  Leave suitcase in the car. After surgery it may be brought to your room.  For patients admitted to the hospital, checkout time is 11:00 AM the day of              discharge.      Please read over the following fact sheets that you were given:     Preparing for Surgery

## 2019-05-24 NOTE — H&P (Addendum)
Whitney Wilson is a 28 y.o. female presenting for repeat c-section.  Pregnancy complicated by gestational HTN.    OB History    Gravida  3   Para  2   Term  2   Preterm  0   AB  0   Living  1     SAB  0   TAB  0   Ectopic  0   Multiple  0   Live Births  1          Past Medical History:  Diagnosis Date  . GERD (gastroesophageal reflux disease)   . PONV (postoperative nausea and vomiting)   . Pregnancy related carpal tunnel syndrome, antepartum    Past Surgical History:  Procedure Laterality Date  . CESAREAN SECTION    . CESAREAN SECTION N/A 02/10/2013   Procedure: Repeat cesarean section with delivery of baby girl at 1328. Apgars 9/9.;  Surgeon: Meriel Pica, MD;  Location: [facility];  Service: Obstetrics;  Laterality: N/A;  Repeat edc 02/17/13  . CHOLECYSTECTOMY  2011  . WISDOM TOOTH EXTRACTION     Family History: family history includes Diabetes in her father. Social History:  reports that she has never smoked. She has never used smokeless tobacco. She reports that she does not drink alcohol or use drugs.     Maternal Diabetes: No Genetic Screening: Normal Maternal Ultrasounds/Referrals: Normal Fetal Ultrasounds or other Referrals:  None Maternal Substance Abuse:  No Significant Maternal Medications:  None Significant Maternal Lab Results:  None Other Comments:  None  ROS History   Last menstrual period 08/07/2018. Exam Physical Exam  Gen - NAD Abd - gravid, NT Ext - NT  Prenatal labs: ABO, Rh: O/Positive/-- (11/14 0000) Antibody: Negative (11/14 0000) Rubella: Immune (11/14 0000) RPR: Nonreactive (11/14 0000)  HBsAg: Negative (11/14 0000)  HIV: Non-reactive (11/14 0000)  GBS:     Assessment/Plan: Repeat c-section  R/b/a discussed, questions answered, informed consent  Zelphia Cairo 05/24/2019, 5:28 PM

## 2019-05-25 ENCOUNTER — Other Ambulatory Visit (HOSPITAL_COMMUNITY)
Admission: RE | Admit: 2019-05-25 | Discharge: 2019-05-25 | Disposition: A | Discharge status: Home / Self Care | Payer: 59 | Source: Ambulatory Visit | Attending: Obstetrics and Gynecology | Admitting: Obstetrics and Gynecology

## 2019-05-25 ENCOUNTER — Inpatient Hospital Stay (HOSPITAL_COMMUNITY)
Admission: RE | Admit: 2019-05-25 | Discharge: 2019-05-25 | Disposition: A | Discharge status: Home / Self Care | Payer: 59 | Source: Ambulatory Visit

## 2019-05-25 ENCOUNTER — Other Ambulatory Visit: Payer: Self-pay

## 2019-05-25 DIAGNOSIS — Z1159 Encounter for screening for other viral diseases: Secondary | ICD-10-CM | POA: Diagnosis not present

## 2019-05-25 NOTE — MAU Note (Signed)
Preadmission COVID swab done. Pt asymptomatic. Specimen sent to lab.

## 2019-05-26 LAB — NOVEL CORONAVIRUS, NAA (HOSP ORDER, SEND-OUT TO REF LAB; TAT 18-24 HRS): SARS-CoV-2, NAA: NOT DETECTED

## 2019-05-27 ENCOUNTER — Inpatient Hospital Stay (HOSPITAL_COMMUNITY): Payer: 59 | Admitting: Anesthesiology

## 2019-05-27 ENCOUNTER — Inpatient Hospital Stay (HOSPITAL_COMMUNITY)
Admission: RE | Admit: 2019-05-27 | Discharge: 2019-05-29 | DRG: 788 | Disposition: A | Discharge status: Home / Self Care | Payer: 59 | Attending: Obstetrics and Gynecology | Admitting: Obstetrics and Gynecology

## 2019-05-27 ENCOUNTER — Encounter (HOSPITAL_COMMUNITY): Payer: Self-pay | Admitting: *Deleted

## 2019-05-27 ENCOUNTER — Encounter (HOSPITAL_COMMUNITY)
Admission: RE | Disposition: A | Discharge status: Home / Self Care | Payer: Self-pay | Source: Home / Self Care | Attending: Obstetrics and Gynecology

## 2019-05-27 ENCOUNTER — Other Ambulatory Visit: Payer: Self-pay

## 2019-05-27 DIAGNOSIS — O134 Gestational [pregnancy-induced] hypertension without significant proteinuria, complicating childbirth: Secondary | ICD-10-CM | POA: Diagnosis present

## 2019-05-27 DIAGNOSIS — Z3A37 37 weeks gestation of pregnancy: Secondary | ICD-10-CM | POA: Diagnosis not present

## 2019-05-27 DIAGNOSIS — O34211 Maternal care for low transverse scar from previous cesarean delivery: Principal | ICD-10-CM | POA: Diagnosis present

## 2019-05-27 DIAGNOSIS — O133 Gestational [pregnancy-induced] hypertension without significant proteinuria, third trimester: Secondary | ICD-10-CM

## 2019-05-27 DIAGNOSIS — O99214 Obesity complicating childbirth: Secondary | ICD-10-CM | POA: Diagnosis present

## 2019-05-27 DIAGNOSIS — O139 Gestational [pregnancy-induced] hypertension without significant proteinuria, unspecified trimester: Secondary | ICD-10-CM | POA: Diagnosis present

## 2019-05-27 LAB — CBC
HCT: 35.1 % — ABNORMAL LOW (ref 36.0–46.0)
Hemoglobin: 11.5 g/dL — ABNORMAL LOW (ref 12.0–15.0)
MCH: 27.7 pg (ref 26.0–34.0)
MCHC: 32.8 g/dL (ref 30.0–36.0)
MCV: 84.6 fL (ref 80.0–100.0)
Platelets: 211 10*3/uL (ref 150–400)
RBC: 4.15 MIL/uL (ref 3.87–5.11)
RDW: 13.3 % (ref 11.5–15.5)
WBC: 8 10*3/uL (ref 4.0–10.5)
nRBC: 0 % (ref 0.0–0.2)

## 2019-05-27 LAB — TYPE AND SCREEN
ABO/RH(D): O POS
Antibody Screen: NEGATIVE

## 2019-05-27 LAB — RPR: RPR Ser Ql: NONREACTIVE

## 2019-05-27 SURGERY — Surgical Case
Anesthesia: Spinal

## 2019-05-27 MED ORDER — SODIUM CHLORIDE 0.9% FLUSH: 3.0000 mL | INTRAVENOUS | Status: DC | PRN

## 2019-05-27 MED ORDER — NALBUPHINE HCL 10 MG/ML IJ SOLN: 5.0000 mg | Freq: Once | INTRAMUSCULAR | Status: DC | PRN

## 2019-05-27 MED ORDER — SIMETHICONE 80 MG PO CHEW
80.0000 mg | CHEWABLE_TABLET | Freq: Three times a day (TID) | ORAL | Status: DC
  Administered 2019-05-27 – 2019-05-29 (×6): 80 mg via ORAL
  Filled 2019-05-27 (×6): qty 1

## 2019-05-27 MED ORDER — MEDROXYPROGESTERONE ACETATE 150 MG/ML IM SUSP: 150.0000 mg | INTRAMUSCULAR | Status: DC | PRN

## 2019-05-27 MED ORDER — SIMETHICONE 80 MG PO CHEW
80.0000 mg | CHEWABLE_TABLET | ORAL | Status: DC
  Administered 2019-05-27 – 2019-05-29 (×2): 80 mg via ORAL
  Filled 2019-05-27 (×2): qty 1

## 2019-05-27 MED ORDER — SENNOSIDES-DOCUSATE SODIUM 8.6-50 MG PO TABS
2.0000 | ORAL_TABLET | ORAL | Status: DC
  Administered 2019-05-27 – 2019-05-29 (×2): 2 via ORAL
  Filled 2019-05-27 (×2): qty 2

## 2019-05-27 MED ORDER — FENTANYL CITRATE (PF) 100 MCG/2ML IJ SOLN
INTRAMUSCULAR | Status: DC | PRN
  Administered 2019-05-27: 15 ug via INTRATHECAL

## 2019-05-27 MED ORDER — SCOPOLAMINE 1 MG/3DAYS TD PT72
MEDICATED_PATCH | TRANSDERMAL | Status: AC
  Filled 2019-05-27: qty 1

## 2019-05-27 MED ORDER — KETOROLAC TROMETHAMINE 30 MG/ML IJ SOLN
30.0000 mg | Freq: Four times a day (QID) | INTRAMUSCULAR | Status: AC | PRN
  Administered 2019-05-27 – 2019-05-28 (×3): 30 mg via INTRAVENOUS
  Filled 2019-05-27 (×2): qty 1

## 2019-05-27 MED ORDER — SCOPOLAMINE 1 MG/3DAYS TD PT72
1.0000 | MEDICATED_PATCH | Freq: Once | TRANSDERMAL | Status: DC
  Administered 2019-05-27: 1.5 mg via TRANSDERMAL

## 2019-05-27 MED ORDER — DIPHENHYDRAMINE HCL 50 MG/ML IJ SOLN
INTRAMUSCULAR | Status: DC | PRN
  Administered 2019-05-27: 12.5 mg via INTRAVENOUS

## 2019-05-27 MED ORDER — MORPHINE SULFATE (PF) 0.5 MG/ML IJ SOLN
INTRAMUSCULAR | Status: AC
  Filled 2019-05-27: qty 10

## 2019-05-27 MED ORDER — TETANUS-DIPHTH-ACELL PERTUSSIS 5-2.5-18.5 LF-MCG/0.5 IM SUSP: 0.5000 mL | Freq: Once | INTRAMUSCULAR | Status: DC

## 2019-05-27 MED ORDER — ONDANSETRON HCL 4 MG/2ML IJ SOLN
INTRAMUSCULAR | Status: DC | PRN
  Administered 2019-05-27: 4 mg via INTRAVENOUS

## 2019-05-27 MED ORDER — DEXAMETHASONE SODIUM PHOSPHATE 4 MG/ML IJ SOLN
INTRAMUSCULAR | Status: AC
  Filled 2019-05-27: qty 7

## 2019-05-27 MED ORDER — SODIUM CHLORIDE 0.9 % IV SOLN
INTRAVENOUS | Status: DC | PRN
  Administered 2019-05-27: 09:00:00 via INTRAVENOUS

## 2019-05-27 MED ORDER — OXYCODONE-ACETAMINOPHEN 5-325 MG PO TABS: 1.0000 | ORAL_TABLET | ORAL | Status: DC | PRN

## 2019-05-27 MED ORDER — ACETAMINOPHEN 500 MG PO TABS
1000.0000 mg | ORAL_TABLET | Freq: Four times a day (QID) | ORAL | Status: AC
  Administered 2019-05-27 – 2019-05-28 (×4): 1000 mg via ORAL
  Filled 2019-05-27 (×4): qty 2

## 2019-05-27 MED ORDER — OXYTOCIN 40 UNITS IN NORMAL SALINE INFUSION - SIMPLE MED: 2.5000 [IU]/h | INTRAVENOUS | Status: AC

## 2019-05-27 MED ORDER — KETOROLAC TROMETHAMINE 30 MG/ML IJ SOLN: 30.0000 mg | Freq: Four times a day (QID) | INTRAMUSCULAR | Status: AC | PRN

## 2019-05-27 MED ORDER — COCONUT OIL OIL: 1.0000 "application " | TOPICAL_OIL | Status: DC | PRN

## 2019-05-27 MED ORDER — DEXTROSE IN LACTATED RINGERS 5 % IV SOLN
INTRAVENOUS | Status: DC
  Administered 2019-05-27 (×2): via INTRAVENOUS

## 2019-05-27 MED ORDER — NALBUPHINE HCL 10 MG/ML IJ SOLN: 5.0000 mg | INTRAMUSCULAR | Status: DC | PRN

## 2019-05-27 MED ORDER — BUPIVACAINE IN DEXTROSE 0.75-8.25 % IT SOLN
INTRATHECAL | Status: DC | PRN
  Administered 2019-05-27: 1.5 mL via INTRATHECAL

## 2019-05-27 MED ORDER — DIPHENHYDRAMINE HCL 50 MG/ML IJ SOLN: 12.5000 mg | INTRAMUSCULAR | Status: DC | PRN

## 2019-05-27 MED ORDER — GENTAMICIN SULFATE 40 MG/ML IJ SOLN
5.0000 mg/kg | INTRAVENOUS | Status: AC
  Administered 2019-05-27: 380 mg via INTRAVENOUS
  Filled 2019-05-27 (×2): qty 9.5

## 2019-05-27 MED ORDER — FENTANYL CITRATE (PF) 100 MCG/2ML IJ SOLN: 25.0000 ug | INTRAMUSCULAR | Status: DC | PRN

## 2019-05-27 MED ORDER — MEASLES, MUMPS & RUBELLA VAC IJ SOLR: 0.5000 mL | Freq: Once | INTRAMUSCULAR | Status: DC

## 2019-05-27 MED ORDER — DEXAMETHASONE SODIUM PHOSPHATE 10 MG/ML IJ SOLN
INTRAMUSCULAR | Status: DC | PRN
  Administered 2019-05-27 (×2): 4 mg via INTRAVENOUS

## 2019-05-27 MED ORDER — METOCLOPRAMIDE HCL 5 MG/ML IJ SOLN
INTRAMUSCULAR | Status: DC | PRN
  Administered 2019-05-27: 10 mg via INTRAVENOUS

## 2019-05-27 MED ORDER — METOCLOPRAMIDE HCL 5 MG/ML IJ SOLN: 10.0000 mg | Freq: Once | INTRAMUSCULAR | Status: DC | PRN

## 2019-05-27 MED ORDER — OXYTOCIN 40 UNITS IN NORMAL SALINE INFUSION - SIMPLE MED
INTRAVENOUS | Status: AC
  Filled 2019-05-27: qty 1000

## 2019-05-27 MED ORDER — LACTATED RINGERS IV SOLN
INTRAVENOUS | Status: DC
  Administered 2019-05-27 (×2): via INTRAVENOUS

## 2019-05-27 MED ORDER — OXYTOCIN 10 UNIT/ML IJ SOLN
INTRAMUSCULAR | Status: DC | PRN
  Administered 2019-05-27: 40 [IU]

## 2019-05-27 MED ORDER — SODIUM CHLORIDE 0.9 % IV SOLN
INTRAVENOUS | Status: DC | PRN
  Administered 2019-05-27: 08:00:00 60 ug/min via INTRAVENOUS

## 2019-05-27 MED ORDER — ACETAMINOPHEN 325 MG PO TABS
650.0000 mg | ORAL_TABLET | ORAL | Status: DC | PRN
  Administered 2019-05-28 – 2019-05-29 (×5): 650 mg via ORAL
  Filled 2019-05-27 (×5): qty 2

## 2019-05-27 MED ORDER — NALOXONE HCL 0.4 MG/ML IJ SOLN: 0.4000 mg | INTRAMUSCULAR | Status: DC | PRN

## 2019-05-27 MED ORDER — METOCLOPRAMIDE HCL 5 MG/ML IJ SOLN
INTRAMUSCULAR | Status: AC
  Filled 2019-05-27: qty 2

## 2019-05-27 MED ORDER — CLINDAMYCIN PHOSPHATE 900 MG/50ML IV SOLN
INTRAVENOUS | Status: AC
  Filled 2019-05-27: qty 50

## 2019-05-27 MED ORDER — MEPERIDINE HCL 25 MG/ML IJ SOLN: 6.2500 mg | INTRAMUSCULAR | Status: DC | PRN

## 2019-05-27 MED ORDER — LACTATED RINGERS IV SOLN: INTRAVENOUS | Status: DC

## 2019-05-27 MED ORDER — PRENATAL MULTIVITAMIN CH
1.0000 | ORAL_TABLET | Freq: Every day | ORAL | Status: DC
  Administered 2019-05-28: 1 via ORAL
  Filled 2019-05-27: qty 1

## 2019-05-27 MED ORDER — NALOXONE HCL 4 MG/10ML IJ SOLN
1.0000 ug/kg/h | INTRAVENOUS | Status: DC | PRN
  Filled 2019-05-27: qty 5

## 2019-05-27 MED ORDER — MORPHINE SULFATE (PF) 0.5 MG/ML IJ SOLN
INTRAMUSCULAR | Status: DC | PRN
  Administered 2019-05-27: .15 ug via INTRATHECAL

## 2019-05-27 MED ORDER — MENTHOL 3 MG MT LOZG: 1.0000 | LOZENGE | OROMUCOSAL | Status: DC | PRN

## 2019-05-27 MED ORDER — ONDANSETRON HCL 4 MG/2ML IJ SOLN: 4.0000 mg | Freq: Three times a day (TID) | INTRAMUSCULAR | Status: DC | PRN

## 2019-05-27 MED ORDER — DIPHENHYDRAMINE HCL 25 MG PO CAPS: 25.0000 mg | ORAL_CAPSULE | ORAL | Status: DC | PRN

## 2019-05-27 MED ORDER — KETOROLAC TROMETHAMINE 30 MG/ML IJ SOLN
INTRAMUSCULAR | Status: AC
  Filled 2019-05-27: qty 1

## 2019-05-27 MED ORDER — DIBUCAINE (PERIANAL) 1 % EX OINT: 1.0000 "application " | TOPICAL_OINTMENT | CUTANEOUS | Status: DC | PRN

## 2019-05-27 MED ORDER — PHENYLEPHRINE 40 MCG/ML (10ML) SYRINGE FOR IV PUSH (FOR BLOOD PRESSURE SUPPORT)
PREFILLED_SYRINGE | INTRAVENOUS | Status: AC
  Filled 2019-05-27: qty 10

## 2019-05-27 MED ORDER — DIPHENHYDRAMINE HCL 25 MG PO CAPS: 25.0000 mg | ORAL_CAPSULE | Freq: Four times a day (QID) | ORAL | Status: DC | PRN

## 2019-05-27 MED ORDER — OXYCODONE HCL 5 MG PO TABS
5.0000 mg | ORAL_TABLET | Freq: Once | ORAL | Status: AC
  Administered 2019-05-27: 5 mg via ORAL
  Filled 2019-05-27: qty 1

## 2019-05-27 MED ORDER — PHENYLEPHRINE HCL-NACL 20-0.9 MG/250ML-% IV SOLN
INTRAVENOUS | Status: AC
  Filled 2019-05-27: qty 250

## 2019-05-27 MED ORDER — WITCH HAZEL-GLYCERIN EX PADS: 1.0000 "application " | MEDICATED_PAD | CUTANEOUS | Status: DC | PRN

## 2019-05-27 MED ORDER — SIMETHICONE 80 MG PO CHEW: 80.0000 mg | CHEWABLE_TABLET | ORAL | Status: DC | PRN

## 2019-05-27 MED ORDER — SODIUM CHLORIDE 0.9 % IR SOLN
Status: DC | PRN
  Administered 2019-05-27: 1

## 2019-05-27 MED ORDER — ONDANSETRON HCL 4 MG/2ML IJ SOLN
INTRAMUSCULAR | Status: AC
  Filled 2019-05-27: qty 2

## 2019-05-27 MED ORDER — CLINDAMYCIN PHOSPHATE 900 MG/50ML IV SOLN
900.0000 mg | INTRAVENOUS | Status: AC
  Administered 2019-05-27: 900 mg via INTRAVENOUS

## 2019-05-27 MED ORDER — FENTANYL CITRATE (PF) 100 MCG/2ML IJ SOLN
INTRAMUSCULAR | Status: AC
  Filled 2019-05-27: qty 2

## 2019-05-27 MED ORDER — NALBUPHINE HCL 10 MG/ML IJ SOLN
5.0000 mg | INTRAMUSCULAR | Status: DC | PRN
  Administered 2019-05-28: 5 mg via INTRAVENOUS
  Filled 2019-05-27: qty 1

## 2019-05-27 SURGICAL SUPPLY — 37 items
ADH SKN CLS APL DERMABOND .7 (GAUZE/BANDAGES/DRESSINGS) ×1
ADH SKN CLS LQ APL DERMABOND (GAUZE/BANDAGES/DRESSINGS) ×1
CHLORAPREP W/TINT 26ML (MISCELLANEOUS) ×3 IMPLANT
CLAMP CORD UMBIL (MISCELLANEOUS) IMPLANT
CLOTH BEACON ORANGE TIMEOUT ST (SAFETY) ×3 IMPLANT
DERMABOND ADHESIVE PROPEN (GAUZE/BANDAGES/DRESSINGS) ×2
DERMABOND ADVANCED (GAUZE/BANDAGES/DRESSINGS) ×2
DERMABOND ADVANCED .7 DNX12 (GAUZE/BANDAGES/DRESSINGS) ×1 IMPLANT
DERMABOND ADVANCED .7 DNX6 (GAUZE/BANDAGES/DRESSINGS) IMPLANT
DRSG OPSITE POSTOP 4X10 (GAUZE/BANDAGES/DRESSINGS) ×3 IMPLANT
ELECT REM PT RETURN 9FT ADLT (ELECTROSURGICAL) ×3
ELECTRODE REM PT RTRN 9FT ADLT (ELECTROSURGICAL) ×1 IMPLANT
EXTRACTOR VACUUM M CUP 4 TUBE (SUCTIONS) IMPLANT
EXTRACTOR VACUUM M CUP 4' TUBE (SUCTIONS)
GLOVE BIO SURGEON STRL SZ 6.5 (GLOVE) ×2 IMPLANT
GLOVE BIO SURGEONS STRL SZ 6.5 (GLOVE) ×1
GLOVE BIOGEL PI IND STRL 7.0 (GLOVE) ×2 IMPLANT
GLOVE BIOGEL PI INDICATOR 7.0 (GLOVE) ×4
GOWN STRL REUS W/TWL LRG LVL3 (GOWN DISPOSABLE) ×6 IMPLANT
KIT ABG SYR 3ML LUER SLIP (SYRINGE) IMPLANT
NDL HYPO 25X5/8 SAFETYGLIDE (NEEDLE) IMPLANT
NEEDLE HYPO 25X5/8 SAFETYGLIDE (NEEDLE) IMPLANT
NS IRRIG 1000ML POUR BTL (IV SOLUTION) ×3 IMPLANT
PACK C SECTION WH (CUSTOM PROCEDURE TRAY) ×3 IMPLANT
PAD OB MATERNITY 4.3X12.25 (PERSONAL CARE ITEMS) ×3 IMPLANT
PENCIL SMOKE EVAC W/HOLSTER (ELECTROSURGICAL) ×3 IMPLANT
SUT CHROMIC 0 CT 1 (SUTURE) ×2 IMPLANT
SUT CHROMIC 0 CT 802H (SUTURE) IMPLANT
SUT CHROMIC 0 CTX 36 (SUTURE) ×9 IMPLANT
SUT MON AB-0 CT1 36 (SUTURE) ×3 IMPLANT
SUT PDS AB 0 CTX 60 (SUTURE) ×3 IMPLANT
SUT PLAIN 0 NONE (SUTURE) IMPLANT
SUT VIC AB 4-0 KS 27 (SUTURE) IMPLANT
SYR BULB 3OZ (MISCELLANEOUS) ×3 IMPLANT
TOWEL OR 17X24 6PK STRL BLUE (TOWEL DISPOSABLE) ×3 IMPLANT
TRAY FOLEY W/BAG SLVR 14FR LF (SET/KITS/TRAYS/PACK) IMPLANT
WATER STERILE IRR 1000ML POUR (IV SOLUTION) ×3 IMPLANT

## 2019-05-27 NOTE — Anesthesia Preprocedure Evaluation (Addendum)
Anesthesia Evaluation  Patient identified by MRN, date of birth, ID band Patient awake    Reviewed: Allergy & Precautions, NPO status , Patient's Chart, lab work & pertinent test results  History of Anesthesia Complications (+) PONV  Airway Mallampati: II  TM Distance: >3 FB Neck ROM: Full    Dental no notable dental hx.    Pulmonary neg pulmonary ROS,    Pulmonary exam normal breath sounds clear to auscultation       Cardiovascular negative cardio ROS Normal cardiovascular exam Rhythm:Regular Rate:Normal     Neuro/Psych negative neurological ROS  negative psych ROS   GI/Hepatic negative GI ROS, Neg liver ROS,   Endo/Other  Morbid obesity  Renal/GU negative Renal ROS  negative genitourinary   Musculoskeletal negative musculoskeletal ROS (+)   Abdominal   Peds negative pediatric ROS (+)  Hematology negative hematology ROS (+)   Anesthesia Other Findings   Reproductive/Obstetrics (+) Pregnancy                             Anesthesia Physical Anesthesia Plan  ASA: II  Anesthesia Plan: Spinal   Post-op Pain Management:    Induction:   PONV Risk Score and Plan: 4 or greater and Ondansetron, Dexamethasone and Scopolamine patch - Pre-op  Airway Management Planned: Natural Airway  Additional Equipment:   Intra-op Plan:   Post-operative Plan:   Informed Consent: I have reviewed the patients History and Physical, chart, labs and discussed the procedure including the risks, benefits and alternatives for the proposed anesthesia with the patient or authorized representative who has indicated his/her understanding and acceptance.     Dental advisory given  Plan Discussed with: CRNA  Anesthesia Plan Comments: (Discussed intrathecal morphine with patient. She agrees to accept it. Will pre treat with IV benadryl and dexamethasone)       Anesthesia Quick Evaluation

## 2019-05-27 NOTE — Anesthesia Postprocedure Evaluation (Signed)
Anesthesia Post Note  Patient: MONNICA THRALL  Procedure(s) Performed: CESAREAN SECTION (N/A )     Patient location during evaluation: PACU Anesthesia Type: Spinal Level of consciousness: awake and alert Pain management: pain level controlled Vital Signs Assessment: post-procedure vital signs reviewed and stable Respiratory status: spontaneous breathing and respiratory function stable Cardiovascular status: blood pressure returned to baseline and stable Postop Assessment: no headache, no backache, spinal receding and no apparent nausea or vomiting Anesthetic complications: no    Last Vitals:  Vitals:   05/27/19 1030 05/27/19 1040  BP:  102/65  Pulse: (!) 53 (!) 50  Resp: 18 17  Temp:  36.6 C  SpO2: 94% 95%    Last Pain:  Vitals:   05/27/19 1015  TempSrc: Oral   Pain Goal:    LLE Motor Response: Purposeful movement (05/27/19 1030)   RLE Motor Response: Purposeful movement (05/27/19 1030)       Epidural/Spinal Function Cutaneous sensation: Able to Discern Pressure (05/27/19 1030), Patient able to flex knees: Yes (05/27/19 1030), Patient able to lift hips off bed: No (05/27/19 1030), Back pain beyond tenderness at insertion site: No (05/27/19 1030), Progressively worsening motor and/or sensory loss: No (05/27/19 1030), Bowel and/or bladder incontinence post epidural: No (05/27/19 1030)  Phillips Grout

## 2019-05-27 NOTE — Op Note (Signed)
Cesarean Section Procedure Note   Whitney Wilson  05/27/2019  Indications: Scheduled Proceedure/Maternal Request   Pre-operative Diagnosis: previous cesaren section,;gestional hypertension.   Post-operative Diagnosis: Same   Surgeon: Surgeon(s) and Role:    Zelphia Cairo, MD - Primary   Assistants: Odelia Gage, RNFA  Anesthesia: spinal   Procedure Details:  The patient was seen in the Holding Room. The risks, benefits, complications, treatment options, and expected outcomes were discussed with the patient. The patient concurred with the proposed plan, giving informed consent. identified as [name] and the procedure verified as C-Section Delivery. A Time Out was held and the above information confirmed.  After induction of anesthesia, the patient was draped and prepped in the usual sterile manner. A transverse was made and carried down through the subcutaneous tissue to the fascia. Fascial incision was made and extended transversely. The fascia was separated from the underlying rectus tissue superiorly and inferiorly. The peritoneum was identified and entered. Peritoneal incision was extended longitudinally. The utero-vesical peritoneal reflection was incised transversely and the bladder flap was bluntly freed from the lower uterine segment. A low transverse uterine incision was made. Delivered from cephalic presentation was a viable infant.  Cord ph was sent the umbilical cord was clamped and cut cord blood was obtained for evaluation. The placenta was removed Intact and appeared normal. The uterine outline, tubes and ovaries appeared normal}. The uterine incision was closed with running locked sutures of 0chromic gut.   Hemostasis was observed. Lavage was carried out until clear. The fascia was then reapproximated with running sutures of 0PDS.The skin was closed with 4-0Vicryl.   Instrument, sponge, and needle counts were correct prior the abdominal closure and were correct at  the conclusion of the case.    Findings:   Estimated Blood Loss: 165 mL    Urine Output: clear  Specimens: none   Complications: no complications  Disposition: PACU - hemodynamically stable.   Maternal Condition: stable   Baby condition / location:  Couplet care / Skin to Skin  Attending Attestation: I was present and scrubbed for the entire procedure.   Signed: Surgeon(s): Zelphia Cairo, MD

## 2019-05-27 NOTE — Anesthesia Procedure Notes (Signed)
Spinal  Patient location during procedure: OR Staffing Anesthesiologist: Sayana Salley, MD Performed: anesthesiologist  Preanesthetic Checklist Completed: patient identified, site marked, surgical consent, pre-op evaluation, timeout performed, IV checked, risks and benefits discussed and monitors and equipment checked Spinal Block Patient position: sitting Prep: DuraPrep Patient monitoring: heart rate, continuous pulse ox and blood pressure Approach: midline Location: L3-4 Injection technique: single-shot Needle Needle type: Sprotte  Needle gauge: 24 G Needle length: 9 cm Additional Notes Expiration date of kit checked and confirmed. Patient tolerated procedure well, without complications.       

## 2019-05-27 NOTE — Transfer of Care (Signed)
Immediate Anesthesia Transfer of Care Note  Patient: Whitney Wilson  Procedure(s) Performed: CESAREAN SECTION (N/A )  Patient Location: PACU  Anesthesia Type:Spinal  Level of Consciousness: awake, alert  and oriented  Airway & Oxygen Therapy: Patient Spontanous Breathing  Post-op Assessment: Report given to RN and Post -op Vital signs reviewed and stable  Post vital signs: Reviewed and stable  Last Vitals:  Vitals Value Taken Time  BP    Temp    Pulse    Resp    SpO2      Last Pain:  Vitals:   05/27/19 0513  TempSrc: Oral         Complications: No apparent anesthesia complications

## 2019-05-28 ENCOUNTER — Encounter (HOSPITAL_COMMUNITY): Payer: Self-pay | Admitting: Obstetrics and Gynecology

## 2019-05-28 LAB — CBC
HCT: 28.2 % — ABNORMAL LOW (ref 36.0–46.0)
Hemoglobin: 9.2 g/dL — ABNORMAL LOW (ref 12.0–15.0)
MCH: 27.7 pg (ref 26.0–34.0)
MCHC: 32.6 g/dL (ref 30.0–36.0)
MCV: 84.9 fL (ref 80.0–100.0)
Platelets: 171 10*3/uL (ref 150–400)
RBC: 3.32 MIL/uL — ABNORMAL LOW (ref 3.87–5.11)
RDW: 13.1 % (ref 11.5–15.5)
WBC: 10.9 10*3/uL — ABNORMAL HIGH (ref 4.0–10.5)
nRBC: 0 % (ref 0.0–0.2)

## 2019-05-28 LAB — BIRTH TISSUE RECOVERY COLLECTION (PLACENTA DONATION)

## 2019-05-28 LAB — ABO/RH: ABO/RH(D): O POS

## 2019-05-28 MED ORDER — OXYCODONE HCL 5 MG PO TABS
5.0000 mg | ORAL_TABLET | Freq: Four times a day (QID) | ORAL | Status: DC | PRN
  Administered 2019-05-28 (×2): 5 mg via ORAL
  Filled 2019-05-28 (×5): qty 1

## 2019-05-28 MED ORDER — OXYCODONE HCL 5 MG PO TABS
10.0000 mg | ORAL_TABLET | ORAL | Status: DC | PRN
  Administered 2019-05-28 – 2019-05-29 (×4): 10 mg via ORAL
  Filled 2019-05-28 (×3): qty 2

## 2019-05-28 MED ORDER — IBUPROFEN 600 MG PO TABS
600.0000 mg | ORAL_TABLET | Freq: Four times a day (QID) | ORAL | Status: DC | PRN
  Administered 2019-05-28 – 2019-05-29 (×4): 600 mg via ORAL
  Filled 2019-05-28 (×4): qty 1

## 2019-05-28 NOTE — Progress Notes (Signed)
Subjective: Postpartum Day 1: Cesarean Delivery Patient reports tolerating PO.    Objective: Vital signs in last 24 hours: Temp:  [97.5 F (36.4 C)-98.8 F (37.1 C)] 97.7 F (36.5 C) (06/05 0600) Pulse Rate:  [48-64] 50 (06/05 0600) Resp:  [16-18] 17 (06/05 0600) BP: (88-110)/(52-72) 94/60 (06/05 0600) SpO2:  [94 %-97 %] 96 % (06/05 0600)  Physical Exam:  General: alert, cooperative and no distress Lochia: appropriate Uterine Fundus: firm Incision: healing well DVT Evaluation: No evidence of DVT seen on physical exam.  Recent Labs    05/27/19 0508 05/28/19 0456  HGB 11.5* 9.2*  HCT 35.1* 28.2*    Assessment/Plan: Status post Cesarean section. Doing well postoperatively.  Continue current care. D/W circumcision of newborn female child-risks reviewed, patient states she understands and agrees Roselle Locus II 05/28/2019, 8:04 AM

## 2019-05-29 MED ORDER — ACETAMINOPHEN 325 MG PO TABS: 650.0000 mg | ORAL_TABLET | Freq: Four times a day (QID) | ORAL | 1 refills | Status: DC | PRN

## 2019-05-29 MED ORDER — OXYCODONE HCL 10 MG PO TABS: 10.0000 mg | ORAL_TABLET | Freq: Four times a day (QID) | ORAL | 0 refills | Status: DC | PRN

## 2019-05-29 MED ORDER — PRENATAL MULTIVITAMIN CH: 1.0000 | ORAL_TABLET | Freq: Every day | ORAL | 1 refills | Status: DC

## 2019-05-29 MED ORDER — IBUPROFEN 600 MG PO TABS: 600.0000 mg | ORAL_TABLET | Freq: Four times a day (QID) | ORAL | 1 refills | Status: DC | PRN

## 2019-05-29 NOTE — Discharge Summary (Signed)
Obstetric Discharge Summary Reason for Admission: cesarean section Prenatal Procedures: none Intrapartum Procedures: cesarean: low cervical, transverse Postpartum Procedures: none Complications-Operative and Postpartum: none Hemoglobin  Date Value Ref Range Status  05/28/2019 9.2 (L) 12.0 - 15.0 g/dL Final   HCT  Date Value Ref Range Status  05/28/2019 28.2 (L) 36.0 - 46.0 % Final    Physical Exam:  General: alert, cooperative and no distress Lochia: appropriate Uterine Fundus: firm Incision: healing well DVT Evaluation: No evidence of DVT seen on physical exam.  Discharge Diagnoses: Term Pregnancy-delivered  Discharge Information: Date: 05/29/2019 Activity: pelvic rest Diet: routine Medications: PNV, Ibuprofen and oxycodone Condition: stable Instructions: refer to practice specific booklet Discharge to: home   Newborn Data: Live born female  Birth Weight: 6 lb 11.8 oz (3055 g) APGAR: 54, 9  Newborn Delivery   Birth date/time:  05/27/2019 08:39:00 Delivery type:  C-Section, Low Transverse Trial of labor:  No C-section categorization:  Repeat     Home with mother.  Shon Millet II 05/29/2019, 7:55 AM

## 2019-06-07 ENCOUNTER — Other Ambulatory Visit (HOSPITAL_COMMUNITY): Admission: RE | Admit: 2019-06-07 | Payer: 59 | Source: Ambulatory Visit

## 2020-03-02 ENCOUNTER — Institutional Professional Consult (permissible substitution): Payer: Medicaid Other | Admitting: Plastic Surgery

## 2020-10-04 IMAGING — CR DG CHEST 2V
2 series · 2 of 2 positions shown · non-contrast
Comparison: None.

CLINICAL DATA: Initial evaluation for acute cough, fever.

EXAM:
CHEST - 2 VIEW

[w chest pa]
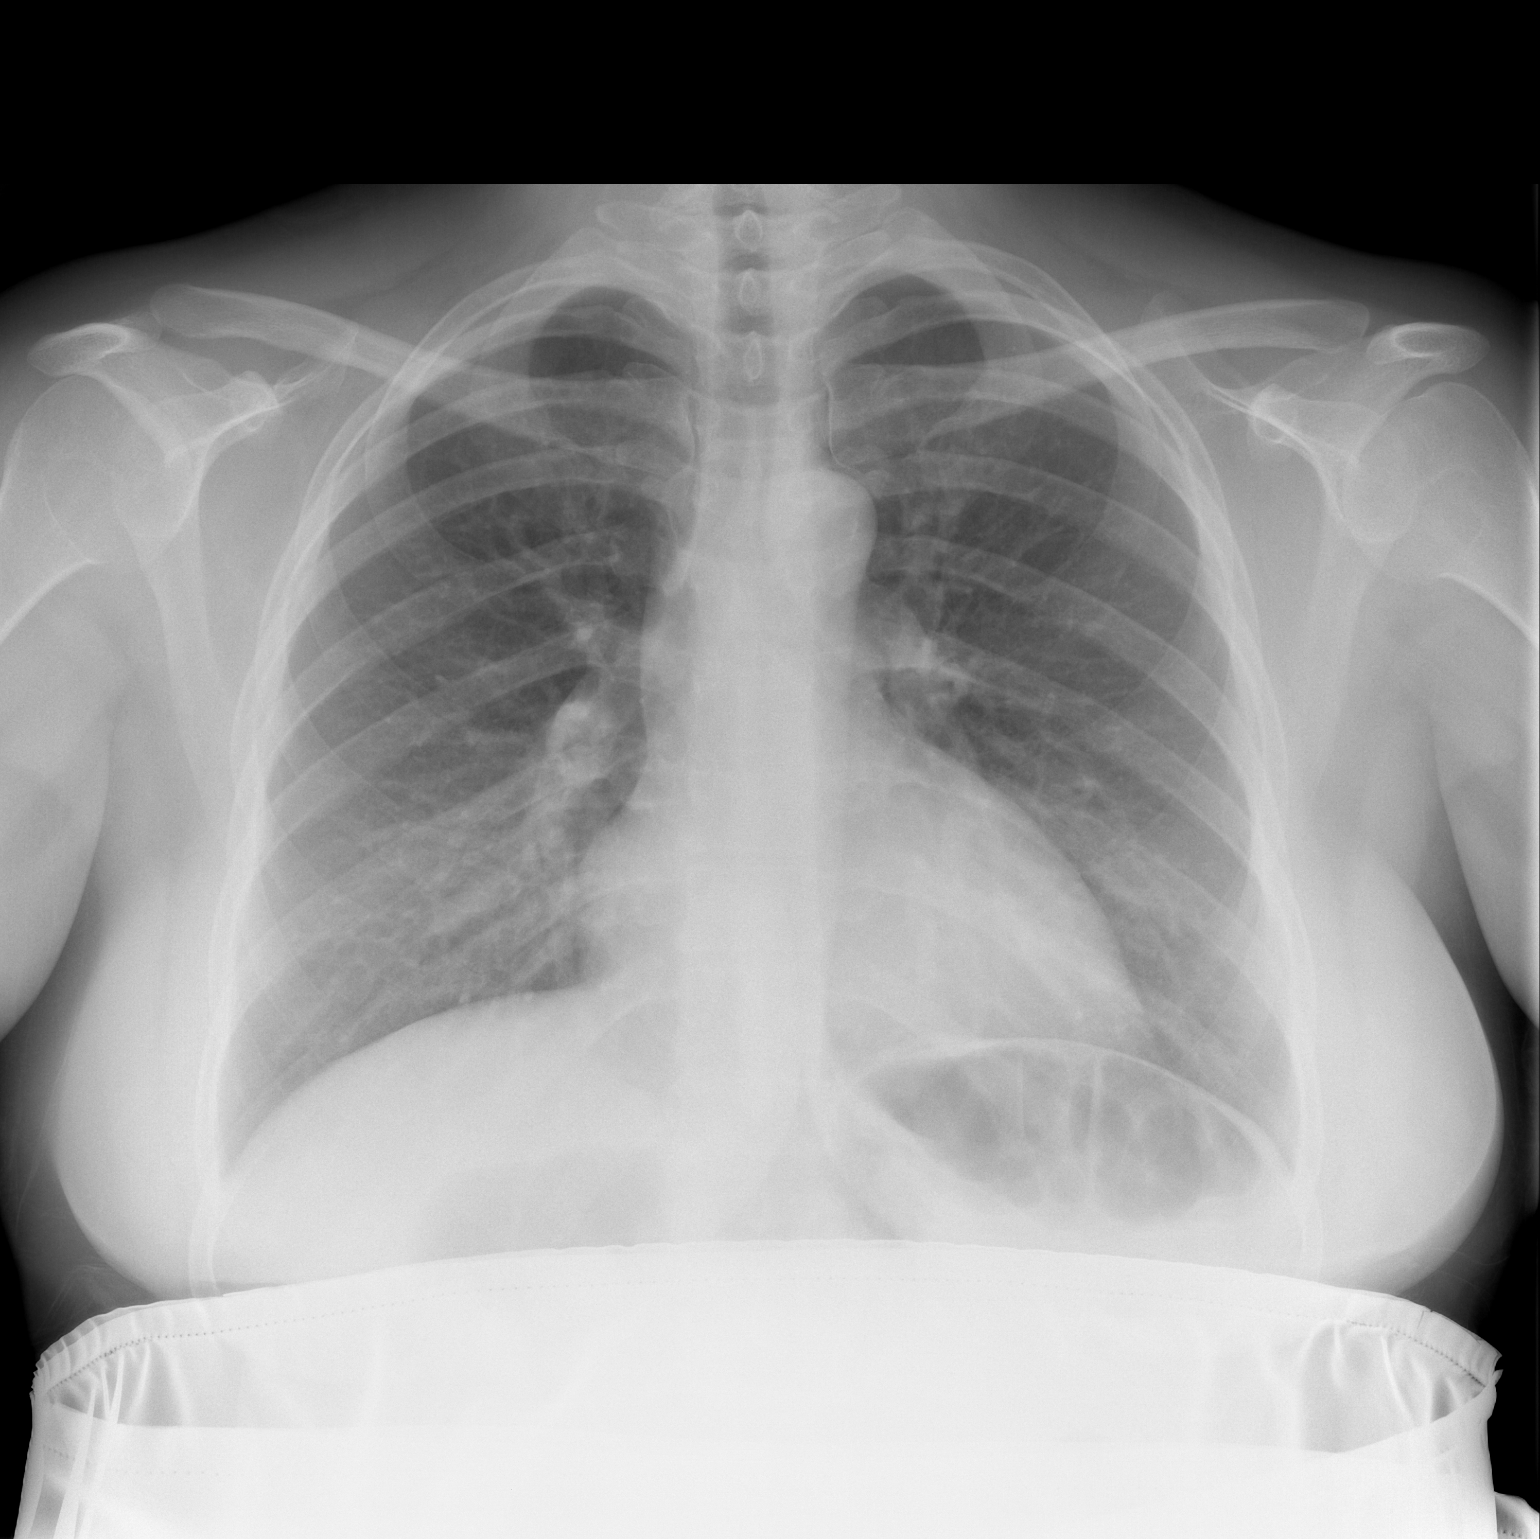

[w chest lat]
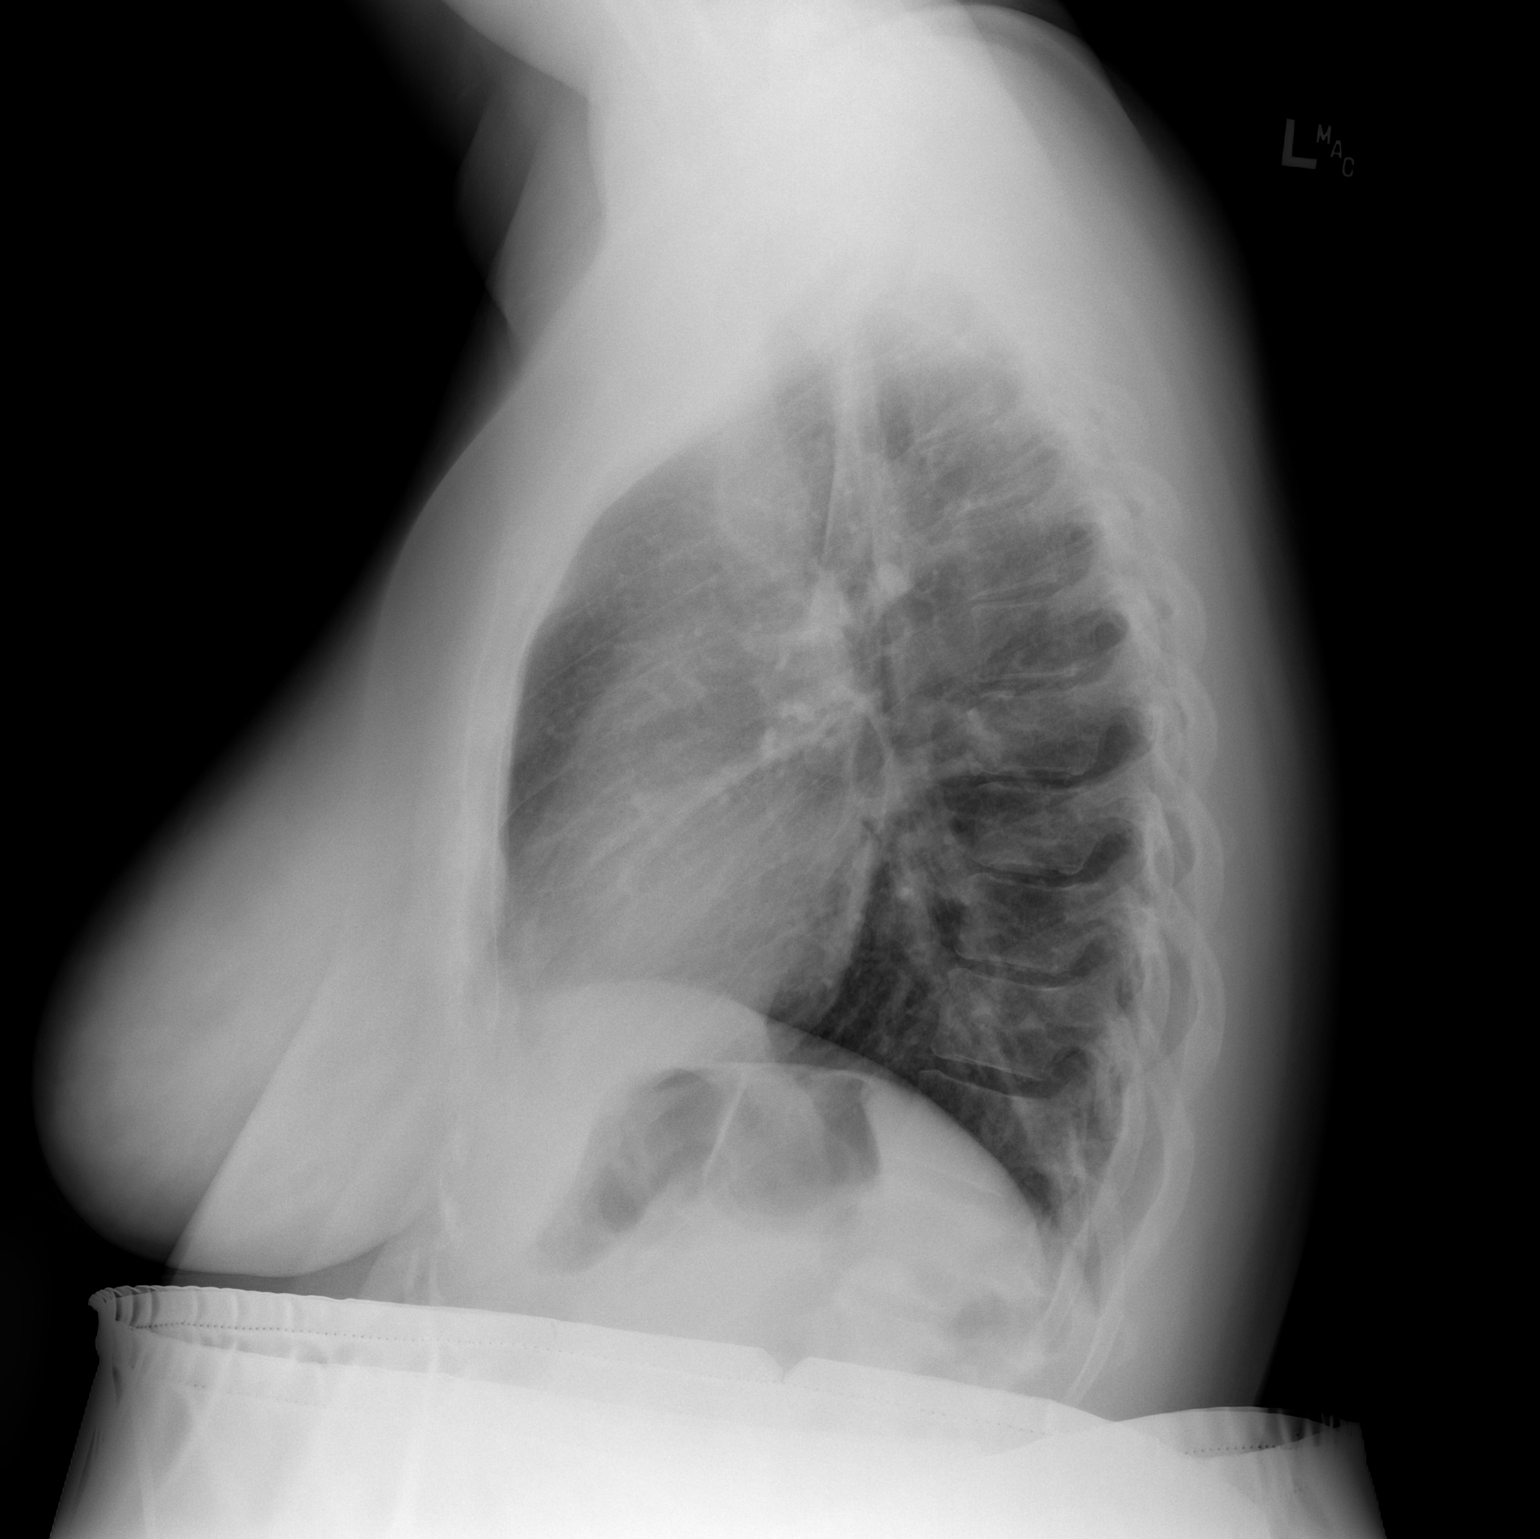

[2 of 2 positions shown; findings below may reference images not displayed]

FINDINGS: Cardiac and mediastinal silhouettes are within normal limits.

Lungs normally inflated. There is subtle hazy opacity at the
posterior left lower lobe, somewhat suspicious for mild and/or early
infiltrate given provided history. No edema or effusion. No
pneumothorax.

Osseous structures within normal limits.
IMPRESSION: Subtle hazy opacity at the posterior left lower lobe, suspicious for
possible early/developing infiltrate given provided history.

## 2022-04-27 ENCOUNTER — Emergency Department (HOSPITAL_BASED_OUTPATIENT_CLINIC_OR_DEPARTMENT_OTHER): Payer: PRIVATE HEALTH INSURANCE

## 2022-04-27 ENCOUNTER — Other Ambulatory Visit: Payer: Self-pay

## 2022-04-27 ENCOUNTER — Emergency Department (HOSPITAL_BASED_OUTPATIENT_CLINIC_OR_DEPARTMENT_OTHER)
Admission: EM | Admit: 2022-04-27 | Discharge: 2022-04-27 | Disposition: A | Discharge status: Home / Self Care | Payer: PRIVATE HEALTH INSURANCE | Attending: Emergency Medicine | Admitting: Emergency Medicine

## 2022-04-27 ENCOUNTER — Encounter (HOSPITAL_BASED_OUTPATIENT_CLINIC_OR_DEPARTMENT_OTHER): Payer: Self-pay | Admitting: Emergency Medicine

## 2022-04-27 DIAGNOSIS — N83202 Unspecified ovarian cyst, left side: Secondary | ICD-10-CM | POA: Insufficient documentation

## 2022-04-27 DIAGNOSIS — R1033 Periumbilical pain: Secondary | ICD-10-CM | POA: Diagnosis present

## 2022-04-27 DIAGNOSIS — Q433 Congenital malformations of intestinal fixation: Secondary | ICD-10-CM | POA: Insufficient documentation

## 2022-04-27 DIAGNOSIS — R197 Diarrhea, unspecified: Secondary | ICD-10-CM | POA: Diagnosis not present

## 2022-04-27 HISTORY — DX: Type 2 diabetes mellitus without complications: E11.9

## 2022-04-27 LAB — URINALYSIS, ROUTINE W REFLEX MICROSCOPIC
Bilirubin Urine: NEGATIVE
Glucose, UA: NEGATIVE mg/dL
Ketones, ur: NEGATIVE mg/dL
Nitrite: NEGATIVE
Protein, ur: NEGATIVE mg/dL
Specific Gravity, Urine: 1.025 (ref 1.005–1.030)
pH: 6.5 (ref 5.0–8.0)

## 2022-04-27 LAB — COMPREHENSIVE METABOLIC PANEL
ALT: 18 U/L (ref 0–44)
AST: 17 U/L (ref 15–41)
Albumin: 4.2 g/dL (ref 3.5–5.0)
Alkaline Phosphatase: 62 U/L (ref 38–126)
Anion gap: 5 (ref 5–15)
BUN: 9 mg/dL (ref 6–20)
CO2: 26 mmol/L (ref 22–32)
Calcium: 8.8 mg/dL — ABNORMAL LOW (ref 8.9–10.3)
Chloride: 110 mmol/L (ref 98–111)
Creatinine, Ser: 0.74 mg/dL (ref 0.44–1.00)
GFR, Estimated: >60 mL/min (ref >60)
Glucose, Bld: 94 mg/dL (ref 70–99)
Potassium: 3.3 mmol/L — ABNORMAL LOW (ref 3.5–5.1)
Sodium: 141 mmol/L (ref 135–145)
Total Bilirubin: 0.4 mg/dL (ref 0.3–1.2)
Total Protein: 7.2 g/dL (ref 6.5–8.1)

## 2022-04-27 LAB — CBC
HCT: 38.1 % (ref 36.0–46.0)
Hemoglobin: 13 g/dL (ref 12.0–15.0)
MCH: 29.2 pg (ref 26.0–34.0)
MCHC: 34.1 g/dL (ref 30.0–36.0)
MCV: 85.6 fL (ref 80.0–100.0)
Platelets: 314 10*3/uL (ref 150–400)
RBC: 4.45 MIL/uL (ref 3.87–5.11)
RDW: 12.6 % (ref 11.5–15.5)
WBC: 8 10*3/uL (ref 4.0–10.5)
nRBC: 0 % (ref 0.0–0.2)

## 2022-04-27 LAB — URINALYSIS, MICROSCOPIC (REFLEX)

## 2022-04-27 LAB — LIPASE, BLOOD: Lipase: 33 U/L (ref 11–51)

## 2022-04-27 LAB — PREGNANCY, URINE: Preg Test, Ur: NEGATIVE

## 2022-04-27 MED ORDER — IOHEXOL 300 MG/ML  SOLN
100.0000 mL | Freq: Once | INTRAMUSCULAR | Status: AC | PRN
  Administered 2022-04-27: 100 mL via INTRAVENOUS

## 2022-04-27 MED ORDER — DICYCLOMINE HCL 20 MG PO TABS: 20.0000 mg | ORAL_TABLET | Freq: Two times a day (BID) | ORAL | 0 refills | Status: DC

## 2022-04-27 MED ORDER — SODIUM CHLORIDE 0.9 % IV BOLUS
1000.0000 mL | Freq: Once | INTRAVENOUS | Status: AC
  Administered 2022-04-27: 1000 mL via INTRAVENOUS

## 2022-04-27 MED ORDER — ONDANSETRON 4 MG PO TBDP: 4.0000 mg | ORAL_TABLET | Freq: Three times a day (TID) | ORAL | 0 refills | Status: DC | PRN

## 2022-04-27 MED ORDER — ONDANSETRON HCL 4 MG/2ML IJ SOLN
4.0000 mg | Freq: Once | INTRAMUSCULAR | Status: AC
  Administered 2022-04-27: 4 mg via INTRAVENOUS
  Filled 2022-04-27: qty 2

## 2022-04-27 NOTE — ED Notes (Signed)
Pt given water for fluid challenge 

## 2022-04-27 NOTE — ED Triage Notes (Signed)
Pt arrives pov with c/o RLQ pain x 3 days, radiating to lower abdomen with diarrhea today. Denies fever or dysuria ?

## 2022-04-27 NOTE — Discharge Instructions (Signed)
As we discussed, your CT scan today shows that you have a cecum that is mobile and moving around your abdomen.  This l will need follow-up with the gastroenterologist and general surgeon but is not causing any issues today but could potentially cause bowel obstructions in the future.  Follow-up with your gynecologist regarding your ovarian cyst. ?Return to the ED with worsening pain, fever, vomiting, any other concerns. ?

## 2022-04-27 NOTE — ED Notes (Signed)
Patient discharged to home.  All discharge instructions reviewed.  Patient verbalized understanding via teachback method.  VS WDL.  Respirations even and unlabored.  Ambulatory out of ED.   °

## 2022-04-27 NOTE — ED Provider Notes (Signed)
?[facility] EMERGENCY DEPARTMENT ?Provider Note ? ? ?CSN: [id number] ?Arrival date & time: 04/27/22  1505 ? ?  ? ?History ? ?Chief Complaint  ?Patient presents with  ? Abdominal Pain  ? ? ?Whitney SellerChristine L Harmon is a 31 y.o. female. ? ?Patient with progressively worsening right sided lower abdominal pain for the past 3 days is becoming more diffuse.  States pain started about 3 days ago around the umbilicus and has now spread to her bilateral lower quadrants.  Pain is fairly constant.  Associate with diarrhea every time she tries to eat.  Has had nausea and decreased appetite not wanting to eat or drink.  No vomiting.  No pain with urination or blood in the urine.  No abnormal vaginal bleeding or discharge.  History of cholecystectomy but no other stomach issues.  Patient denies any black or bloody stools.  No chest pain or shortness of breath. ?Still has appendix.  Still has uterus and ovaries.  Denies possibility of pregnancy ?No travel or sick contacts ? ?The history is provided by the patient.  ?Abdominal Pain ?Associated symptoms: diarrhea and nausea   ?Associated symptoms: no cough, no dysuria, no fever, no hematuria, no shortness of breath, no vaginal discharge and no vomiting   ? ?  ? ?Home Medications ?Prior to Admission medications   ?Medication Sig Start Date End Date Taking? Authorizing Provider  ?acetaminophen (TYLENOL) 325 MG tablet Take 2 tablets (650 mg total) by mouth every 6 (six) hours as needed for mild pain (temperature > 101.5.). 05/29/19   Harold Hedgeomblin, James, MD  ?ibuprofen (ADVIL) 600 MG tablet Take 1 tablet (600 mg total) by mouth every 6 (six) hours as needed for moderate pain. 05/29/19   Harold Hedgeomblin, James, MD  ?oxyCODONE 10 MG TABS Take 1 tablet (10 mg total) by mouth every 6 (six) hours as needed for severe pain. 05/29/19   Harold Hedgeomblin, James, MD  ?Prenatal Vit-Fe Fumarate-FA (PRENATAL MULTIVITAMIN) TABS tablet Take 1 tablet by mouth daily at 12 noon. 05/29/19   Harold Hedgeomblin, James, MD  ?   ? ?Allergies     ?Morphine and related, Unasyn [ampicillin-sulbactam sodium], and Amoxicillin   ? ?Review of Systems   ?Review of Systems  ?Constitutional:  Positive for activity change and appetite change. Negative for fever.  ?HENT:  Negative for congestion and rhinorrhea.   ?Respiratory:  Negative for cough, chest tightness and shortness of breath.   ?Gastrointestinal:  Positive for abdominal pain, diarrhea and nausea. Negative for vomiting.  ?Genitourinary:  Negative for dysuria, hematuria and vaginal discharge.  ?Musculoskeletal:  Negative for arthralgias and myalgias.  ?Neurological:  Negative for dizziness, weakness and headaches.  ? all other systems are negative except as noted in the HPI and PMH.  ? ?Physical Exam ?Updated Vital Signs ?BP 134/83 (BP Location: Right Arm)   Pulse (!) 50   Temp 98.2 ?F (36.8 ?C)   Resp 17   Ht 5\' 4"  (1.626 m)   Wt 102.1 kg   SpO2 100%   BMI 38.62 kg/m?  ?Physical Exam ?Vitals and nursing note reviewed.  ?Constitutional:   ?   General: She is not in acute distress. ?   Appearance: She is well-developed. She is not ill-appearing.  ?HENT:  ?   Head: Normocephalic and atraumatic.  ?   Mouth/Throat:  ?   Pharynx: No oropharyngeal exudate.  ?Eyes:  ?   Conjunctiva/sclera: Conjunctivae normal.  ?   Pupils: Pupils are equal, round, and reactive to light.  ?Neck:  ?  Comments: No meningismus. ?Cardiovascular:  ?   Rate and Rhythm: Normal rate and regular rhythm.  ?   Heart sounds: Normal heart sounds. No murmur heard. ?Pulmonary:  ?   Effort: Pulmonary effort is normal. No respiratory distress.  ?   Breath sounds: Normal breath sounds.  ?Chest:  ?   Chest wall: No tenderness.  ?Abdominal:  ?   Palpations: Abdomen is soft.  ?   Tenderness: There is abdominal tenderness. There is no guarding or rebound.  ?   Comments: Diffuse tenderness, periumbilical, RLQ and LLQ.  ?No guarding or rebound.  ?Musculoskeletal:     ?   General: No tenderness. Normal range of motion.  ?   Cervical back: Normal  range of motion and neck supple.  ?   Comments: No CVAT  ?Skin: ?   General: Skin is warm.  ?   Capillary Refill: Capillary refill takes less than 2 seconds.  ?Neurological:  ?   General: No focal deficit present.  ?   Mental Status: She is alert and oriented to person, place, and time. Mental status is at baseline.  ?   Cranial Nerves: No cranial nerve deficit.  ?   Motor: No abnormal muscle tone.  ?   Coordination: Coordination normal.  ?   Comments:  5/5 strength throughout. CN 2-12 intact.Equal grip strength.   ?Psychiatric:     ?   Behavior: Behavior normal.  ? ? ?ED Results / Procedures / Treatments   ?Labs ?(all labs ordered are listed, but only abnormal results are displayed) ?Labs Reviewed  ?COMPREHENSIVE METABOLIC PANEL - Abnormal; Notable for the following components:  ?    Result Value  ? Potassium 3.3 (*)   ? Calcium 8.8 (*)   ? All other components within normal limits  ?URINALYSIS, ROUTINE W REFLEX MICROSCOPIC - Abnormal; Notable for the following components:  ? APPearance CLOUDY (*)   ? Hgb urine dipstick LARGE (*)   ? Leukocytes,Ua SMALL (*)   ? All other components within normal limits  ?URINALYSIS, MICROSCOPIC (REFLEX) - Abnormal; Notable for the following components:  ? Bacteria, UA MANY (*)   ? All other components within normal limits  ?URINE CULTURE  ?LIPASE, BLOOD  ?CBC  ?PREGNANCY, URINE  ? ? ?EKG ?None ? ?Radiology ?CT ABDOMEN PELVIS W CONTRAST ? ?Result Date: 04/27/2022 ?CLINICAL DATA:  Right lower quadrant abdominal pain for 3 days EXAM: CT ABDOMEN AND PELVIS WITH CONTRAST TECHNIQUE: Multidetector CT imaging of the abdomen and pelvis was performed using the standard protocol following bolus administration of intravenous contrast. RADIATION DOSE REDUCTION: This exam was performed according to the departmental dose-optimization program which includes automated exposure control, adjustment of the mA and/or kV according to patient size and/or use of iterative reconstruction technique. CONTRAST:   OMNIPAQUE IOHEXOL 300 MG/ML  SOLN COMPARISON:  05/30/2014 FINDINGS: Lower chest: Included lung bases are clear. Heart size within normal limits. Hepatobiliary: No focal liver abnormality is seen. Status post cholecystectomy. No biliary dilatation. Pancreas: Unremarkable. No pancreatic ductal dilatation or surrounding inflammatory changes. Spleen: Normal in size without focal abnormality. Adrenals/Urinary Tract: Unremarkable adrenal glands. Chronic areas of cortical scarring within the right kidney. Kidneys enhance symmetrically without focal lesion, stone, or hydronephrosis. Ureters are nondilated. Urinary bladder appears unremarkable. Stomach/Bowel: Stomach within normal limits. No dilated loops of bowel. Appropriate positioning of the ligament of Treitz. Cecum is located within the mid abdomen, left of midline. A normal appendix is present within the mid abdomen near the midline (  series 2, image 47). No focal bowel wall thickening or inflammatory changes. Vascular/Lymphatic: No significant vascular abnormality is seen. No significant twist of the mesenteric vessels. There are scattered nonenlarged central mesenteric lymph nodes. No abdominopelvic lymphadenopathy. Reproductive: IUD within the uterus. Left adnexal cyst measuring up to 4.9 cm. No right adnexal abnormality. Other: No free fluid. No abdominopelvic fluid collection. No pneumoperitoneum. No abdominal wall hernia. Musculoskeletal: No acute or significant osseous findings. IMPRESSION: 1. No acute abdominopelvic findings.  Normal appendix. 2. Findings suggestive of a mobile cecum. Cecum is located within the mid abdomen, left of midline and was previously seen within the right lower quadrant on CT 05/30/2014. 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 There are no findings to suggest volvulus, obstruction, or bowel inflammation. 3. Left adnexal cyst measuring up to 4.9 cm. No follow-up imaging  is recommended. Reference: JACR 2020 Feb;17(2):248-254 Electronically Signed   By: Duanne Guess D.O.   On: 04/27/2022 20:43   ? ?Procedures ?Procedures  ? ? ?Medications Ordered in ED ?Medications  ?sodium ch

## 2022-04-29 ENCOUNTER — Encounter: Payer: Self-pay | Admitting: Gastroenterology

## 2022-04-29 LAB — URINE CULTURE

## 2022-05-07 ENCOUNTER — Other Ambulatory Visit (HOSPITAL_BASED_OUTPATIENT_CLINIC_OR_DEPARTMENT_OTHER): Payer: Self-pay

## 2022-05-07 ENCOUNTER — Other Ambulatory Visit: Payer: Self-pay

## 2022-05-07 ENCOUNTER — Emergency Department (HOSPITAL_BASED_OUTPATIENT_CLINIC_OR_DEPARTMENT_OTHER)
Admission: EM | Admit: 2022-05-07 | Discharge: 2022-05-07 | Disposition: A | Discharge status: Home / Self Care | Payer: PRIVATE HEALTH INSURANCE | Attending: Emergency Medicine | Admitting: Emergency Medicine

## 2022-05-07 ENCOUNTER — Encounter (HOSPITAL_BASED_OUTPATIENT_CLINIC_OR_DEPARTMENT_OTHER): Payer: Self-pay | Admitting: *Deleted

## 2022-05-07 DIAGNOSIS — L02411 Cutaneous abscess of right axilla: Secondary | ICD-10-CM | POA: Diagnosis present

## 2022-05-07 DIAGNOSIS — L739 Follicular disorder, unspecified: Secondary | ICD-10-CM

## 2022-05-07 DIAGNOSIS — N6331 Unspecified lump in axillary tail of the right breast: Secondary | ICD-10-CM | POA: Diagnosis present

## 2022-05-07 DIAGNOSIS — L662 Folliculitis decalvans: Secondary | ICD-10-CM | POA: Insufficient documentation

## 2022-05-07 MED ORDER — MUPIROCIN 2 % EX OINT
1.0000 "application " | TOPICAL_OINTMENT | Freq: Two times a day (BID) | CUTANEOUS | 0 refills | Status: DC
  Filled 2022-05-07: qty 22, 11d supply, fill #0

## 2022-05-07 MED ORDER — CLINDAMYCIN HCL 300 MG PO CAPS
300.0000 mg | ORAL_CAPSULE | Freq: Three times a day (TID) | ORAL | 0 refills | Status: AC
  Filled 2022-05-07: qty 30, 10d supply, fill #0

## 2022-05-07 NOTE — ED Triage Notes (Signed)
States has several boil type areas under rt arm ?

## 2022-05-07 NOTE — Discharge Instructions (Addendum)
Take oral antibiotic as prescribed.  I have prescribed you a topical antibiotic as well.  Topical antibiotic could be useful for these type issues in the future as well.  Recommend continued use of warm compresses. ?

## 2022-05-07 NOTE — ED Provider Notes (Signed)
?[facility] EMERGENCY DEPARTMENT ?Provider Note ? ? ?CSN: [id number] ?Arrival date & time: 05/07/22  0741 ? ?  ? ?History ? ?Chief Complaint  ?Patient presents with  ? Abscess  ? ? ?Whitney Wilson is a 31 y.o. female. ? ?Patient here with concern for abscess in the right axilla.  There for several days.  Has tried some topical antibiotic ointment without much improvement.  Denies any fevers, chills.  No history of the same.  Nothing has made it worse or better.  Warm compresses have helped a little bit.  Worse with movement. ? ?The history is provided by the patient.  ? ?  ? ?Home Medications ?Prior to Admission medications   ?Medication Sig Start Date End Date Taking? Authorizing Provider  ?clindamycin (CLEOCIN) 300 MG capsule Take 1 capsule (300 mg total) by mouth 3 (three) times daily for 10 days. 05/07/22 05/17/22 Yes Jezebelle Ledwell, DO  ?mupirocin cream (BACTROBAN) 2 % Apply 1 application. topically 2 (two) times daily. 05/07/22  Yes Tatumn Corbridge, DO  ?acetaminophen (TYLENOL) 325 MG tablet Take 2 tablets (650 mg total) by mouth every 6 (six) hours as needed for mild pain (temperature > 101.5.). 05/29/19   Everlene Farrier, MD  ?dicyclomine (BENTYL) 20 MG tablet Take 1 tablet (20 mg total) by mouth 2 (two) times daily. 04/27/22   Rancour, Annie Main, MD  ?ibuprofen (ADVIL) 600 MG tablet Take 1 tablet (600 mg total) by mouth every 6 (six) hours as needed for moderate pain. 05/29/19   Everlene Farrier, MD  ?ondansetron (ZOFRAN-ODT) 4 MG disintegrating tablet Take 1 tablet (4 mg total) by mouth every 8 (eight) hours as needed for nausea or vomiting. 04/27/22   Rancour, Annie Main, MD  ?oxyCODONE 10 MG TABS Take 1 tablet (10 mg total) by mouth every 6 (six) hours as needed for severe pain. 05/29/19   Everlene Farrier, MD  ?Prenatal Vit-Fe Fumarate-FA (PRENATAL MULTIVITAMIN) TABS tablet Take 1 tablet by mouth daily at 12 noon. 05/29/19   Everlene Farrier, MD  ?   ? ?Allergies    ?Morphine and related, Unasyn [ampicillin-sulbactam  sodium], and Amoxicillin   ? ?Review of Systems   ?Review of Systems ? ?Physical Exam ?Updated Vital Signs ?BP 140/88 (BP Location: Left Arm)   Pulse 86   Temp 98.5 ?F (36.9 ?C) (Oral)   Resp 16   Ht 5\' 4"  (1.626 m)   Wt 102 kg   SpO2 99%   BMI 38.60 kg/m?  ?Physical Exam ?Constitutional:   ?   General: She is not in acute distress. ?   Appearance: She is not ill-appearing.  ?HENT:  ?   Head: Normocephalic.  ?Skin: ?   General: Skin is warm.  ?   Capillary Refill: Capillary refill takes less than 2 seconds.  ?   Comments: 2 areas of mild folliculitis in the right axilla, bedside ultrasound showed no obvious large abscess there is no major erythema or drainage and fairly localized  ?Neurological:  ?   Mental Status: She is alert.  ? ? ?ED Results / Procedures / Treatments   ?Labs ?(all labs ordered are listed, but only abnormal results are displayed) ?Labs Reviewed - No data to display ? ?EKG ?None ? ?Radiology ?No results found. ? ?Procedures ?Procedures  ? ? ?Medications Ordered in ED ?Medications - No data to display ? ?ED Course/ Medical Decision Making/ A&P ?  ?                        ?  Medical Decision Making ?Risk ?Prescription drug management. ? ? ?[name] is here with concern for abscess in her right axilla.  Appears to have 2 areas of folliculitis in her right axilla.  These are not large or very fluctuant or very concerned at this time.  Will prescribe topical Bactroban and clindamycin orally.  Recommend continued use of warm compresses.  No fever.  No signs of systemic infection.  Bedside ultrasound confirmed that this is more of a mild inflammatory/infectious process with no obvious abscess to drain.  Understands return precautions.  Discharge. ? ?This chart was dictated using voice recognition software.  Despite best efforts to proofread,  errors can occur which can change the documentation meaning.  ? ?EMERGENCY DEPARTMENT US SOFT TISSUE INTERPRETATION ?"Study: Limited Soft Tissue  Ultrasound" ? ?INDICATIONS: Soft tissue infection ?Multiple views of the body part were obtained in real-time with a multi-frequency linear probe ? ?PERFORMED BY: Myself ?IMAGES ARCHIVED?: No ?SIDE:Right  ?BODY PART:Axilla ?INTERPRETATION:  Cellulitis present ?  ? ? ? ? ? ? ? ?Final Clinical Impression(s) / ED Diagnoses ?Final diagnoses:  ?Folliculitis  ? ? ?Rx / DC Orders ?ED Discharge Orders   ? ?      Ordered  ?  mupirocin cream (BACTROBAN) 2 %  2 times daily       ? 05/07/22 0754  ?  clindamycin (CLEOCIN) 300 MG capsule  3 times daily       ? 05/07/22 0754  ? ?  ?  ? ?  ? ? ?  ?Lennice Sites, DO ?05/07/22 [id number] ? ?

## 2022-05-16 ENCOUNTER — Ambulatory Visit: Payer: PRIVATE HEALTH INSURANCE | Admitting: Gastroenterology

## 2022-12-26 ENCOUNTER — Institutional Professional Consult (permissible substitution): Payer: PRIVATE HEALTH INSURANCE | Admitting: Plastic Surgery

## 2023-01-22 ENCOUNTER — Ambulatory Visit
Admission: RE | Admit: 2023-01-22 | Discharge: 2023-01-22 | Disposition: A | Discharge status: Home / Self Care | Payer: PRIVATE HEALTH INSURANCE | Source: Ambulatory Visit | Attending: Family Medicine | Admitting: Family Medicine

## 2023-01-22 ENCOUNTER — Encounter: Payer: Self-pay | Admitting: Plastic Surgery

## 2023-01-22 ENCOUNTER — Ambulatory Visit: Payer: PRIVATE HEALTH INSURANCE | Admitting: Plastic Surgery

## 2023-01-22 ENCOUNTER — Other Ambulatory Visit: Payer: Self-pay | Admitting: Family Medicine

## 2023-01-22 VITALS — BP 143/89 | HR 62 | Ht 64.0 in | Wt 190.0 lb

## 2023-01-22 DIAGNOSIS — N62 Hypertrophy of breast: Secondary | ICD-10-CM | POA: Diagnosis not present

## 2023-01-22 DIAGNOSIS — M533 Sacrococcygeal disorders, not elsewhere classified: Secondary | ICD-10-CM

## 2023-01-22 DIAGNOSIS — M546 Pain in thoracic spine: Secondary | ICD-10-CM

## 2023-01-22 DIAGNOSIS — M542 Cervicalgia: Secondary | ICD-10-CM | POA: Diagnosis not present

## 2023-01-22 DIAGNOSIS — M793 Panniculitis, unspecified: Secondary | ICD-10-CM | POA: Diagnosis not present

## 2023-01-22 NOTE — Progress Notes (Signed)
Referring Provider Whitney Pearson, MD [address] [place],  [place] [address]   CC:  Chief Complaint  Patient presents with   Consult      Whitney Wilson is an 32 y.o. female.  HPI: [name] presents today for evaluation for bilateral breast reduction.  She states that she has always had a large breast but after a recent weight loss of 60 pounds she finds i it is difficult for her to find a bra that fits properly and allows her to do her normal daily activities.  Currently the bra that she wears causes upper back and neck pain.  During our discussion she also mentioned that she has been having problems with the excess skin on the anterior portion of her abdomen.  This is increased in size also with the weight loss and also interferes with her daily activities and with her ability to wear clothes.  Allergies  Allergen Reactions   Morphine And Related Hives   Unasyn [Ampicillin-Sulbactam Sodium] Hives    Did it involve swelling of the face/tongue/throat, SOB, or low BP? No Did it involve sudden or severe rash/hives, skin peeling, or any reaction on the inside of your mouth or nose? Yes Did you need to seek medical attention at a hospital or doctor's office? No When did it last happen?     9 years ago If all above answers are "NO", may proceed with cephalosporin use.    Amoxicillin Rash    Did it involve swelling of the face/tongue/throat, SOB, or low BP? No Did it involve sudden or severe rash/hives, skin peeling, or any reaction on the inside of your mouth or nose? No Did you need to seek medical attention at a hospital or doctor's office? No When did it last happen?      6 months If all above answers are "NO", may proceed with cephalosporin use.     Outpatient Encounter Medications as of 01/22/2023  Medication Sig   acetaminophen (TYLENOL) 325 MG tablet Take 2 tablets (650 mg total) by mouth every 6 (six) hours as needed for mild pain (temperature > 101.5.).    dicyclomine (BENTYL) 20 MG tablet Take 1 tablet (20 mg total) by mouth 2 (two) times daily.   ibuprofen (ADVIL) 600 MG tablet Take 1 tablet (600 mg total) by mouth every 6 (six) hours as needed for moderate pain.   levonorgestrel (MIRENA, 52 MG,) 20 MCG/DAY IUD 1 each by Intrauterine route once.   mupirocin ointment (BACTROBAN) 2 % Apply 1 application on to the skin 2 (two) times daily.   ondansetron (ZOFRAN-ODT) 4 MG disintegrating tablet Take 1 tablet (4 mg total) by mouth every 8 (eight) hours as needed for nausea or vomiting.   oxyCODONE 10 MG TABS Take 1 tablet (10 mg total) by mouth every 6 (six) hours as needed for severe pain.   Prenatal Vit-Fe Fumarate-FA (PRENATAL MULTIVITAMIN) TABS tablet Take 1 tablet by mouth daily at 12 noon.   No facility-administered encounter medications on file as of 01/22/2023.     Past Medical History:  Diagnosis Date   Diabetes mellitus without complication (HCC)    GERD (gastroesophageal reflux disease)    PONV (postoperative nausea and vomiting)    Pregnancy related carpal tunnel syndrome, antepartum     Past Surgical History:  Procedure Laterality Date   CESAREAN SECTION     CESAREAN SECTION N/A 02/10/2013   Procedure: Repeat cesarean section with delivery of baby girl at 66.  Apgars 9/9.;  Surgeon: Margarette Asal, MD;  Location: [facility];  Service: Obstetrics;  Laterality: N/A;  Repeat edc 02/17/13   CESAREAN SECTION N/A 05/27/2019   Procedure: CESAREAN SECTION;  Surgeon: Whitney Pearson, MD;  Location: [facility];  Service: Obstetrics;  Laterality: N/A;  Repeat edc 6/22 allergy to morphine & unasyn need RNFA   CHOLECYSTECTOMY  2011   WISDOM TOOTH EXTRACTION      Family History  Problem Relation Age of Onset   Diabetes Father    Other Neg Hx     Social History   Social History Narrative   Not on file     Review of Systems General: Denies fevers, chills, weight loss CV: Denies chest pain, shortness of breath,  palpitations Breast: Patient has complaints of large breasts with difficulty finding bras that fit properly as well as upper back and neck pain Pannus: Patient complains of excess anterior abdominal wall skin and fat which interfere with her daily activities and significantly impact her self-esteem.  Physical Exam    01/22/2023   10:07 AM 05/07/2022    7:51 AM 05/07/2022    7:49 AM  Vitals with BMI  Height 5\' 4"   5\' 4"   Weight 190 lbs  224 lbs 14 oz  BMI 22.2  97.98  Systolic 921 194   Diastolic 89 88   Pulse 62 86     General:  No acute distress,  Alert and oriented, Non-Toxic, Normal speech and affect Breast: Has moderately large breasts that are pendulous with grade 3 ptosis.  There is no palpable abnormalities and no obvious nipple abnormalities or nipple discharge. Pannus: Patient has an apron of skin which hangs to her symphysis pubis.  On the posterior aspect there is evidence of chronic infection with folliculitis as noted in the photographs. Mammogram: Patient has not begun mammography Assessment/Plan Symptomatic macromastia: Complaints of the large size of her breast and the difficulty in finding bras that fit her properly.  She is requesting a breast reduction. I believe I can remove between 400 and 450 gms per breast. We discussed at length the procedure including the location of the incisions and the unpredictable nature of scarring.  We discussed the risks of bleeding, infection, and seroma formation.  We discussed the risks of nipple loss due to loss of blood supply to the nipple and areolar complex.  We discussed the use of drains.  We discussed the postoperative limitations including no heavy lifting, no vigorous activity, and no submerging the incisions in water.  Stands that she will have to wear a compressive garment for 6 weeks. Panniculitis: The patient has a modest pannus which hangs to the symphysis pubis.  There is evidence of folliculitis on the posterior aspect of the  pannus.  We discussed panniculectomy including the location of the incisions what is actually excised and the risks of bleeding, infection, and seroma formation.  She understands that she will also have drains for the panniculectomy.  She also understands that she will need to wear a compressive garment for 6 weeks after the procedure. Will submit for insurance consideration.  Whitney Wilson 01/22/2023, 11:24 AM

## 2023-02-25 ENCOUNTER — Telehealth: Payer: Self-pay

## 2023-02-25 NOTE — Telephone Encounter (Signed)
[id number] with Dan at [facility]. No PA on BL breast reduction nor panniculectomy. Suggested to fax Pre-Determination to [phone]  Received fax confirmation.

## 2023-03-05 ENCOUNTER — Telehealth: Payer: Self-pay

## 2023-03-05 NOTE — Telephone Encounter (Signed)
[id number] case is still pending under Pre-D

## 2023-03-12 ENCOUNTER — Telehealth: Payer: Self-pay | Admitting: Plastic Surgery

## 2023-03-12 NOTE — Telephone Encounter (Signed)
Pt called in checking on auth for procedure.  Would like a follow up call.

## 2023-03-13 NOTE — Telephone Encounter (Signed)
Called [employer] it is still in Pre-D. Called patient and advised the status. Will call her back once we hear back from them.

## 2023-03-19 ENCOUNTER — Telehealth: Payer: Self-pay

## 2023-03-19 NOTE — Telephone Encounter (Signed)
Emailed color photos to [email or web address].  Could not observe from black and white photos.

## 2023-03-25 ENCOUNTER — Telehealth: Payer: Self-pay

## 2023-03-25 NOTE — Telephone Encounter (Signed)
Recent photos to [facility] with [employer]. Original photos went to wrong address

## 2023-03-27 ENCOUNTER — Telehealth: Payer: Self-pay

## 2023-03-27 NOTE — Telephone Encounter (Signed)
Approved [id number] and [id number] ref#[id number]  eff 03/27/23-09/23/23. Patient is aware. Gave file to [facility] to schedule SX

## 2023-04-03 ENCOUNTER — Telehealth: Payer: Self-pay | Admitting: *Deleted

## 2023-04-03 NOTE — Telephone Encounter (Signed)
Spoke with patient to schedule sx and related appts. Unable to schedule pre-op, 1 day post op for drains, 3 & 5 wk post ops because PA schedule is not yet out for July - Sept

## 2023-06-03 ENCOUNTER — Encounter: Payer: Self-pay | Admitting: Plastic Surgery

## 2023-06-03 NOTE — Telephone Encounter (Signed)
See message.

## 2023-07-01 ENCOUNTER — Telehealth: Payer: Self-pay | Admitting: *Deleted

## 2023-07-01 NOTE — Telephone Encounter (Signed)
Auth started with new plan - [employer] via their portal.

## 2023-07-15 NOTE — Telephone Encounter (Signed)
Reconsideration request faxed to [phone] & [phone]  Included: Fax cover [employer] approval Transition of care form Referral Consult note Photos

## 2023-07-24 ENCOUNTER — Other Ambulatory Visit: Payer: Self-pay

## 2023-07-24 ENCOUNTER — Encounter (HOSPITAL_BASED_OUTPATIENT_CLINIC_OR_DEPARTMENT_OTHER): Payer: Self-pay | Admitting: Plastic Surgery

## 2023-07-31 ENCOUNTER — Telehealth (INDEPENDENT_AMBULATORY_CARE_PROVIDER_SITE_OTHER): Payer: No Typology Code available for payment source | Admitting: Student

## 2023-07-31 DIAGNOSIS — M793 Panniculitis, unspecified: Secondary | ICD-10-CM

## 2023-07-31 DIAGNOSIS — N62 Hypertrophy of breast: Secondary | ICD-10-CM

## 2023-07-31 MED ORDER — ONDANSETRON HCL 4 MG PO TABS: 4.0000 mg | ORAL_TABLET | Freq: Three times a day (TID) | ORAL | 0 refills | Status: DC | PRN

## 2023-07-31 MED ORDER — OXYCODONE HCL 5 MG PO TABS: 5.0000 mg | ORAL_TABLET | Freq: Three times a day (TID) | ORAL | 0 refills | Status: DC | PRN

## 2023-07-31 NOTE — H&P (View-Only) (Signed)
 Patient ID: Whitney Wilson, female    DOB: 06/26/1991, 32 y.o.   MRN: [id number]  Preoperative appointment    ICD-10-CM   1. Macromastia  N62     2. Panniculitis  M79.3        History of Present Illness: [name] is a 32 y.o.  female  with a history of macromastia and panniculitis.  She presents for preoperative evaluation for upcoming procedure, panniculectomy and bilateral breast reduction, scheduled for 08/01/2023 with Dr. Ladona Ridgel.  The patient has not had problems with anesthesia.  Patient denies ever needing a mammogram.  She states that her paternal grandmother and her paternal aunt have had breast cancer.  She denies any personal history of breast cancer.  Patient denies any cardiac issues.  She did state that she fell about 6 months ago, and saw cardiology after her fall, but nothing ever came of it and she was not diagnosed with any cardiac issues.  Patient reports she is not a smoker.  Patient states she currently has a Mirena IUD.  She denies any history of miscarriages.  She denies any personal family history of blood clots or clotting diseases.  She denies any recent traumas, surgeries, infections.  She denies any history of stroke or heart attack.  She denies any history of Crohn's disease or ulcerative colitis.  She denies any history of COPD or asthma.  She denies any history of cancer.  She denies any varicosities to her lower extremities or swollen legs.  She denies any recent fevers or chills.  Patient states that she is currently a DD cup.  She states that she would like to be a D cup.  Discussed with patient that cup size cannot be guaranteed.  Patient expressed understanding.  Summary of Previous Visit: Patient seen by Dr. Ladona Ridgel on 01/22/2023.  At this visit, patient reported she had large breasts but after recent 60 pound weight loss she found it difficult to find a bra that fit properly and allowed her to do normal daily activities.  She reports she is having  back and neck pain.  She also did report she was having problems with excess skin on the anterior portion of her abdomen.  Plan was to move forward with breast reduction.  The amount removed at the time of surgery was believed to be 400 to 450 g per side.  Plan was to also move forward with panniculectomy.  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  PMH Significant for: Macromastia  Patient states she is no longer taking phentermine or Ozempic.  She states the only medication she takes is a multivitamin.  She states she has been holding this per surgery center recommendations.  Patient also states that she was prediabetic at some point, but she is no longer.   Past Medical History: Allergies: Allergies  Allergen Reactions   Morphine And Codeine Hives   Unasyn [Ampicillin-Sulbactam Sodium] Hives    Did it involve swelling of the face/tongue/throat, SOB, or low BP? No Did it involve sudden or severe rash/hives, skin peeling, or any reaction on the inside of your mouth or nose? Yes Did you need to seek medical attention at a hospital or doctor's office? No When did it last happen?     9 years ago If all above answers are "NO", may proceed with cephalosporin use.    Amoxicillin Rash  Did it involve swelling of the face/tongue/throat, SOB, or low BP? No Did it involve sudden or severe rash/hives, skin peeling, or any reaction on the inside of your mouth or nose? No Did you need to seek medical attention at a hospital or doctor's office? No When did it last happen?      6 months If all above answers are "NO", may proceed with cephalosporin use.     Current Medications:  Current Outpatient Medications:    acetaminophen (TYLENOL) 325 MG tablet, Take 2 tablets (650 mg total) by mouth every 6 (six) hours as needed for mild pain (temperature > 101.5.)., Disp: 60 tablet, Rfl: 1   levonorgestrel (MIRENA, 52 MG,) 20  MCG/DAY IUD, 1 each by Intrauterine route once., Disp: , Rfl:    mupirocin ointment (BACTROBAN) 2 %, Apply 1 application on to the skin 2 (two) times daily., Disp: 22 g, Rfl: 0  Past Medical Problems: Past Medical History:  Diagnosis Date   GERD (gastroesophageal reflux disease)    PONV (postoperative nausea and vomiting)    Pre-diabetes    Pregnancy related carpal tunnel syndrome, antepartum     Past Surgical History: Past Surgical History:  Procedure Laterality Date   CESAREAN SECTION     CESAREAN SECTION N/A 02/10/2013   Procedure: Repeat cesarean section with delivery of baby girl at 1328. Apgars 9/9.;  Surgeon: Meriel Pica, MD;  Location: [facility];  Service: Obstetrics;  Laterality: N/A;  Repeat edc 02/17/13   CESAREAN SECTION N/A 05/27/2019   Procedure: CESAREAN SECTION;  Surgeon: Zelphia Cairo, MD;  Location: [facility];  Service: Obstetrics;  Laterality: N/A;  Repeat edc 6/22 allergy to morphine & unasyn need RNFA   CHOLECYSTECTOMY  2011   WISDOM TOOTH EXTRACTION      Social History: Social History   Socioeconomic History   Marital status: Married    Spouse name: Not on file   Number of children: Not on file   Years of education: Not on file   Highest education level: Not on file  Occupational History   Not on file  Tobacco Use   Smoking status: Never   Smokeless tobacco: Never  Vaping Use   Vaping status: Never Used  Substance and Sexual Activity   Alcohol use: Yes    Comment: occ   Drug use: No   Sexual activity: Yes    Birth control/protection: I.U.D.  Other Topics Concern   Not on file  Social History Narrative   Not on file   Social Determinants of Health   Financial Resource Strain: Low Risk  (05/24/2019)   Overall Financial Resource Strain (CARDIA)    Difficulty of Paying Living Expenses: Not hard at all  Food Insecurity: No Food Insecurity (05/24/2019)   Hunger Vital Sign    Worried About Running Out of Food in the Last Year: Never true     Ran Out of Food in the Last Year: Never true  Transportation Needs: Unknown (05/24/2019)   PRAPARE - Administrator, Civil Service (Medical): No    Lack of Transportation (Non-Medical): Not on file  Physical Activity: Not on file  Stress: No Stress Concern Present (05/24/2019)   [employer] of Occupational Health - Occupational Stress Questionnaire    Feeling of Stress : Only a little  Social Connections: Not on file  Intimate Partner Violence: Not At Risk (05/24/2019)   Humiliation, Afraid, Rape, and Kick questionnaire    Fear of Current or Ex-Partner: No  Emotionally Abused: No    Physically Abused: No    Sexually Abused: No    Family History: Family History  Problem Relation Age of Onset   Diabetes Father    Other Neg Hx     Review of Systems: Denies any fevers or chills  Physical Exam:  Physical Exam  Constitutional:      General: Not in acute distress.    Appearance: Normal appearance. Not ill-appearing.  HENT:     Head: Normocephalic and atraumatic.  Pulmonary:     Effort: Pulmonary effort is normal. No respiratory distress.   Neurological:     Mental Status: Alert and oriented to person, place, and time. Mental status is at baseline.  Psychiatric:        Mood and Affect: Mood normal.        Behavior: Behavior normal.    Assessment/Plan: The patient is scheduled for bilateral breast reduction panniculectomy with Dr. Ladona Ridgel.  Risks, benefits, and alternatives of procedure discussed, questions answered and consent obtained.    Smoking Status: Non-smoker; Counseling Given?  N/A Last Mammogram: N/A;   Caprini Score: 3; Risk Factors include: BMI > 25, and length of planned surgery. Recommendation for mechanical prophylaxis. Encourage early ambulation.   Pictures obtained: @consult   Post-op Rx sent to pharmacy: Oxycodone, Zofran  Patient was provided with the General Surgical Risk consent document and Pain Medication Agreement prior to their  appointment.  They had adequate time to read through the risk consent documents and Pain Medication Agreement. We also discussed them together during this preop appointment. All of their questions were answered to their satisfaction.  Recommended calling if they have any further questions.    The consent was obtained with risks and complications reviewed which included bleeding, pain, scar, infection and the risk of anesthesia.  The patients questions were answered to the patients expressed satisfaction.    Electronically signed by: Laurena Spies, PA-C 07/31/2023 3:19 PM

## 2023-07-31 NOTE — Anesthesia Preprocedure Evaluation (Addendum)
Anesthesia Evaluation  Patient identified by MRN, date of birth, ID band Patient awake    Reviewed: Allergy & Precautions, NPO status , Patient's Chart, lab work & pertinent test results  History of Anesthesia Complications (+) PONV and history of anesthetic complications  Airway Mallampati: II  TM Distance: >3 FB Neck ROM: Full    Dental no notable dental hx. (+) Teeth Intact, Dental Advisory Given   Pulmonary neg pulmonary ROS   Pulmonary exam normal breath sounds clear to auscultation       Cardiovascular hypertension, Normal cardiovascular exam Rhythm:Regular Rate:Normal     Neuro/Psych negative neurological ROS  negative psych ROS   GI/Hepatic Neg liver ROS,GERD  ,,  Endo/Other    Morbid obesity  Renal/GU negative Renal ROS  negative genitourinary   Musculoskeletal negative musculoskeletal ROS (+)    Abdominal   Peds negative pediatric ROS (+)  Hematology negative hematology ROS (+)   Anesthesia Other Findings   Reproductive/Obstetrics (+) Pregnancy                             Anesthesia Physical Anesthesia Plan  ASA: 2  Anesthesia Plan: General   Post-op Pain Management: Minimal or no pain anticipated, Tylenol PO (pre-op)*, Celebrex PO (pre-op)* and Dilaudid IV   Induction: Intravenous  PONV Risk Score and Plan: 4 or greater and Ondansetron, Dexamethasone and Scopolamine patch - Pre-op  Airway Management Planned: Oral ETT and LMA  Additional Equipment: None  Intra-op Plan:   Post-operative Plan: Extubation in OR  Informed Consent: I have reviewed the patients History and Physical, chart, labs and discussed the procedure including the risks, benefits and alternatives for the proposed anesthesia with the patient or authorized representative who has indicated his/her understanding and acceptance.     Dental advisory given  Plan Discussed with: CRNA and  Anesthesiologist  Anesthesia Plan Comments:         Anesthesia Quick Evaluation

## 2023-07-31 NOTE — Progress Notes (Signed)
Patient ID: Whitney Wilson, female    DOB: 06/26/1991, 32 y.o.   MRN: [id number]  Preoperative appointment    ICD-10-CM   1. Macromastia  N62     2. Panniculitis  M79.3        History of Present Illness: Whitney Wilson is a 32 y.o.  female  with a history of macromastia and panniculitis.  She presents for preoperative evaluation for upcoming procedure, panniculectomy and bilateral breast reduction, scheduled for 08/01/2023 with Dr. Ladona Ridgel.  The patient has not had problems with anesthesia.  Patient denies ever needing a mammogram.  She states that her paternal grandmother and her paternal aunt have had breast cancer.  She denies any personal history of breast cancer.  Patient denies any cardiac issues.  She did state that she fell about 6 months ago, and saw cardiology after her fall, but nothing ever came of it and she was not diagnosed with any cardiac issues.  Patient reports she is not a smoker.  Patient states she currently has a Mirena IUD.  She denies any history of miscarriages.  She denies any personal family history of blood clots or clotting diseases.  She denies any recent traumas, surgeries, infections.  She denies any history of stroke or heart attack.  She denies any history of Crohn's disease or ulcerative colitis.  She denies any history of COPD or asthma.  She denies any history of cancer.  She denies any varicosities to her lower extremities or swollen legs.  She denies any recent fevers or chills.  Patient states that she is currently a DD cup.  She states that she would like to be a D cup.  Discussed with patient that cup size cannot be guaranteed.  Patient expressed understanding.  Summary of Previous Visit: Patient seen by Dr. Ladona Ridgel on 01/22/2023.  At this visit, patient reported she had large breasts but after recent 60 pound weight loss she found it difficult to find a bra that fit properly and allowed her to do normal daily activities.  She reports she is having  back and neck pain.  She also did report she was having problems with excess skin on the anterior portion of her abdomen.  Plan was to move forward with breast reduction.  The amount removed at the time of surgery was believed to be 400 to 450 g per side.  Plan was to also move forward with panniculectomy.  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  PMH Significant for: Macromastia  Patient states she is no longer taking phentermine or Ozempic.  She states the only medication she takes is a multivitamin.  She states she has been holding this per surgery center recommendations.  Patient also states that she was prediabetic at some point, but she is no longer.   Past Medical History: Allergies: Allergies  Allergen Reactions   Morphine And Codeine Hives   Unasyn [Ampicillin-Sulbactam Sodium] Hives    Did it involve swelling of the face/tongue/throat, SOB, or low BP? No Did it involve sudden or severe rash/hives, skin peeling, or any reaction on the inside of your mouth or nose? Yes Did you need to seek medical attention at a hospital or doctor's office? No When did it last happen?     9 years ago If all above answers are "NO", may proceed with cephalosporin use.    Amoxicillin Rash  Did it involve swelling of the face/tongue/throat, SOB, or low BP? No Did it involve sudden or severe rash/hives, skin peeling, or any reaction on the inside of your mouth or nose? No Did you need to seek medical attention at a hospital or doctor's office? No When did it last happen?      6 months If all above answers are "NO", may proceed with cephalosporin use.     Current Medications:  Current Outpatient Medications:    acetaminophen (TYLENOL) 325 MG tablet, Take 2 tablets (650 mg total) by mouth every 6 (six) hours as needed for mild pain (temperature > 101.5.)., Disp: 60 tablet, Rfl: 1   levonorgestrel (MIRENA, 52 MG,) 20  MCG/DAY IUD, 1 each by Intrauterine route once., Disp: , Rfl:    mupirocin ointment (BACTROBAN) 2 %, Apply 1 application on to the skin 2 (two) times daily., Disp: 22 g, Rfl: 0  Past Medical Problems: Past Medical History:  Diagnosis Date   GERD (gastroesophageal reflux disease)    PONV (postoperative nausea and vomiting)    Pre-diabetes    Pregnancy related carpal tunnel syndrome, antepartum     Past Surgical History: Past Surgical History:  Procedure Laterality Date   CESAREAN SECTION     CESAREAN SECTION N/A 02/10/2013   Procedure: Repeat cesarean section with delivery of baby girl at 1328. Apgars 9/9.;  Surgeon: Meriel Pica, MD;  Location: [facility];  Service: Obstetrics;  Laterality: N/A;  Repeat edc 02/17/13   CESAREAN SECTION N/A 05/27/2019   Procedure: CESAREAN SECTION;  Surgeon: Zelphia Cairo, MD;  Location: [facility];  Service: Obstetrics;  Laterality: N/A;  Repeat edc 6/22 allergy to morphine & unasyn need RNFA   CHOLECYSTECTOMY  2011   WISDOM TOOTH EXTRACTION      Social History: Social History   Socioeconomic History   Marital status: Married    Spouse name: Not on file   Number of children: Not on file   Years of education: Not on file   Highest education level: Not on file  Occupational History   Not on file  Tobacco Use   Smoking status: Never   Smokeless tobacco: Never  Vaping Use   Vaping status: Never Used  Substance and Sexual Activity   Alcohol use: Yes    Comment: occ   Drug use: No   Sexual activity: Yes    Birth control/protection: I.U.D.  Other Topics Concern   Not on file  Social History Narrative   Not on file   Social Determinants of Health   Financial Resource Strain: Low Risk  (05/24/2019)   Overall Financial Resource Strain (CARDIA)    Difficulty of Paying Living Expenses: Not hard at all  Food Insecurity: No Food Insecurity (05/24/2019)   Hunger Vital Sign    Worried About Running Out of Food in the Last Year: Never true     Ran Out of Food in the Last Year: Never true  Transportation Needs: Unknown (05/24/2019)   PRAPARE - Administrator, Civil Service (Medical): No    Lack of Transportation (Non-Medical): Not on file  Physical Activity: Not on file  Stress: No Stress Concern Present (05/24/2019)   [employer] of Occupational Health - Occupational Stress Questionnaire    Feeling of Stress : Only a little  Social Connections: Not on file  Intimate Partner Violence: Not At Risk (05/24/2019)   Humiliation, Afraid, Rape, and Kick questionnaire    Fear of Current or Ex-Partner: No  Emotionally Abused: No    Physically Abused: No    Sexually Abused: No    Family History: Family History  Problem Relation Age of Onset   Diabetes Father    Other Neg Hx     Review of Systems: Denies any fevers or chills  Physical Exam:  Physical Exam  Constitutional:      General: Not in acute distress.    Appearance: Normal appearance. Not ill-appearing.  HENT:     Head: Normocephalic and atraumatic.  Pulmonary:     Effort: Pulmonary effort is normal. No respiratory distress.   Neurological:     Mental Status: Alert and oriented to person, place, and time. Mental status is at baseline.  Psychiatric:        Mood and Affect: Mood normal.        Behavior: Behavior normal.    Assessment/Plan: The patient is scheduled for bilateral breast reduction panniculectomy with Dr. Ladona Ridgel.  Risks, benefits, and alternatives of procedure discussed, questions answered and consent obtained.    Smoking Status: Non-smoker; Counseling Given?  N/A Last Mammogram: N/A;   Caprini Score: 3; Risk Factors include: BMI > 25, and length of planned surgery. Recommendation for mechanical prophylaxis. Encourage early ambulation.   Pictures obtained: @consult   Post-op Rx sent to pharmacy: Oxycodone, Zofran  Patient was provided with the General Surgical Risk consent document and Pain Medication Agreement prior to their  appointment.  They had adequate time to read through the risk consent documents and Pain Medication Agreement. We also discussed them together during this preop appointment. All of their questions were answered to their satisfaction.  Recommended calling if they have any further questions.    The consent was obtained with risks and complications reviewed which included bleeding, pain, scar, infection and the risk of anesthesia.  The patients questions were answered to the patients expressed satisfaction.    Electronically signed by: Laurena Spies, PA-C 07/31/2023 3:19 PM

## 2023-08-01 ENCOUNTER — Ambulatory Visit (HOSPITAL_BASED_OUTPATIENT_CLINIC_OR_DEPARTMENT_OTHER)
Admission: RE | Admit: 2023-08-01 | Discharge: 2023-08-01 | Disposition: A | Discharge status: Home / Self Care | Payer: No Typology Code available for payment source | Attending: Plastic Surgery | Admitting: Plastic Surgery

## 2023-08-01 ENCOUNTER — Other Ambulatory Visit: Payer: Self-pay

## 2023-08-01 ENCOUNTER — Encounter (HOSPITAL_BASED_OUTPATIENT_CLINIC_OR_DEPARTMENT_OTHER): Payer: Self-pay | Admitting: Plastic Surgery

## 2023-08-01 ENCOUNTER — Ambulatory Visit (HOSPITAL_BASED_OUTPATIENT_CLINIC_OR_DEPARTMENT_OTHER): Payer: No Typology Code available for payment source | Admitting: Anesthesiology

## 2023-08-01 ENCOUNTER — Encounter (HOSPITAL_BASED_OUTPATIENT_CLINIC_OR_DEPARTMENT_OTHER)
Admission: RE | Disposition: A | Discharge status: Home / Self Care | Payer: Self-pay | Source: Home / Self Care | Attending: Plastic Surgery

## 2023-08-01 DIAGNOSIS — M793 Panniculitis, unspecified: Secondary | ICD-10-CM

## 2023-08-01 DIAGNOSIS — N62 Hypertrophy of breast: Secondary | ICD-10-CM | POA: Insufficient documentation

## 2023-08-01 DIAGNOSIS — Z793 Long term (current) use of hormonal contraceptives: Secondary | ICD-10-CM | POA: Diagnosis not present

## 2023-08-01 DIAGNOSIS — Z803 Family history of malignant neoplasm of breast: Secondary | ICD-10-CM | POA: Diagnosis not present

## 2023-08-01 DIAGNOSIS — Z01818 Encounter for other preprocedural examination: Secondary | ICD-10-CM

## 2023-08-01 DIAGNOSIS — Z6833 Body mass index (BMI) 33.0-33.9, adult: Secondary | ICD-10-CM | POA: Insufficient documentation

## 2023-08-01 DIAGNOSIS — I10 Essential (primary) hypertension: Secondary | ICD-10-CM | POA: Diagnosis not present

## 2023-08-01 HISTORY — DX: Prediabetes: R73.03

## 2023-08-01 HISTORY — PX: BREAST REDUCTION SURGERY: SHX8

## 2023-08-01 LAB — BASIC METABOLIC PANEL
Anion gap: 6 (ref 5–15)
BUN: 9 mg/dL (ref 6–20)
CO2: 25 mmol/L (ref 22–32)
Calcium: 8.7 mg/dL — ABNORMAL LOW (ref 8.9–10.3)
Chloride: 106 mmol/L (ref 98–111)
Creatinine, Ser: 0.63 mg/dL (ref 0.44–1.00)
GFR, Estimated: >60 mL/min (ref >60)
Glucose, Bld: 133 mg/dL — ABNORMAL HIGH (ref 70–99)
Potassium: 3.7 mmol/L (ref 3.5–5.1)
Sodium: 137 mmol/L (ref 135–145)

## 2023-08-01 LAB — POCT PREGNANCY, URINE: Preg Test, Ur: NEGATIVE

## 2023-08-01 SURGERY — MAMMOPLASTY, REDUCTION
Anesthesia: General | Site: Breast | Laterality: Bilateral

## 2023-08-01 MED ORDER — SODIUM CHLORIDE 0.9 % IV SOLN
INTRAVENOUS | Status: DC | PRN
  Administered 2023-08-01: 40 mL

## 2023-08-01 MED ORDER — MEPERIDINE HCL 25 MG/ML IJ SOLN: 6.2500 mg | INTRAMUSCULAR | Status: DC | PRN

## 2023-08-01 MED ORDER — CHLORHEXIDINE GLUCONATE CLOTH 2 % EX PADS: 6.0000 | MEDICATED_PAD | Freq: Once | CUTANEOUS | Status: DC

## 2023-08-01 MED ORDER — OXYCODONE HCL 5 MG PO TABS
5.0000 mg | ORAL_TABLET | Freq: Once | ORAL | Status: AC | PRN
  Administered 2023-08-01: 5 mg via ORAL

## 2023-08-01 MED ORDER — CELECOXIB 200 MG PO CAPS
200.0000 mg | ORAL_CAPSULE | Freq: Once | ORAL | Status: AC
  Administered 2023-08-01: 200 mg via ORAL

## 2023-08-01 MED ORDER — ONDANSETRON HCL 4 MG/2ML IJ SOLN
INTRAMUSCULAR | Status: AC
  Filled 2023-08-01: qty 2

## 2023-08-01 MED ORDER — VANCOMYCIN HCL IN DEXTROSE 1-5 GM/200ML-% IV SOLN
1000.0000 mg | INTRAVENOUS | Status: AC
  Administered 2023-08-01: 1000 mg via INTRAVENOUS

## 2023-08-01 MED ORDER — SODIUM CHLORIDE (PF) 0.9 % IJ SOLN
INTRAMUSCULAR | Status: AC
  Filled 2023-08-01: qty 20

## 2023-08-01 MED ORDER — DEXMEDETOMIDINE HCL IN NACL 400 MCG/100ML IV SOLN
INTRAVENOUS | Status: DC | PRN
  Administered 2023-08-01: 12 ug via INTRAVENOUS
  Administered 2023-08-01: 8 ug via INTRAVENOUS

## 2023-08-01 MED ORDER — EPHEDRINE SULFATE (PRESSORS) 50 MG/ML IJ SOLN
INTRAMUSCULAR | Status: DC | PRN
  Administered 2023-08-01: 10 mg via INTRAVENOUS

## 2023-08-01 MED ORDER — SCOPOLAMINE 1 MG/3DAYS TD PT72
MEDICATED_PATCH | TRANSDERMAL | Status: AC
  Filled 2023-08-01: qty 1

## 2023-08-01 MED ORDER — LIDOCAINE HCL (CARDIAC) PF 100 MG/5ML IV SOSY
PREFILLED_SYRINGE | INTRAVENOUS | Status: DC | PRN
  Administered 2023-08-01: 80 mg via INTRAVENOUS

## 2023-08-01 MED ORDER — FENTANYL CITRATE (PF) 100 MCG/2ML IJ SOLN
INTRAMUSCULAR | Status: AC
  Filled 2023-08-01: qty 2

## 2023-08-01 MED ORDER — ACETAMINOPHEN 325 MG PO TABS: 325.0000 mg | ORAL_TABLET | ORAL | Status: DC | PRN

## 2023-08-01 MED ORDER — DEXAMETHASONE SODIUM PHOSPHATE 10 MG/ML IJ SOLN
INTRAMUSCULAR | Status: AC
  Filled 2023-08-01: qty 1

## 2023-08-01 MED ORDER — ACETAMINOPHEN 500 MG PO TABS
ORAL_TABLET | ORAL | Status: AC
  Filled 2023-08-01: qty 2

## 2023-08-01 MED ORDER — PROPOFOL 10 MG/ML IV BOLUS
INTRAVENOUS | Status: AC
  Filled 2023-08-01: qty 20

## 2023-08-01 MED ORDER — FENTANYL CITRATE (PF) 100 MCG/2ML IJ SOLN
INTRAMUSCULAR | Status: DC | PRN
  Administered 2023-08-01 (×2): 25 ug via INTRAVENOUS
  Administered 2023-08-01 (×2): 50 ug via INTRAVENOUS
  Administered 2023-08-01: 25 ug via INTRAVENOUS
  Administered 2023-08-01 (×2): 50 ug via INTRAVENOUS
  Administered 2023-08-01 (×3): 25 ug via INTRAVENOUS

## 2023-08-01 MED ORDER — LIDOCAINE 2% (20 MG/ML) 5 ML SYRINGE
INTRAMUSCULAR | Status: AC
  Filled 2023-08-01: qty 5

## 2023-08-01 MED ORDER — BUPIVACAINE LIPOSOME 1.3 % IJ SUSP
INTRAMUSCULAR | Status: AC
  Filled 2023-08-01: qty 20

## 2023-08-01 MED ORDER — ONDANSETRON HCL 4 MG/2ML IJ SOLN
4.0000 mg | Freq: Once | INTRAMUSCULAR | Status: AC | PRN
  Administered 2023-08-01: 4 mg via INTRAVENOUS

## 2023-08-01 MED ORDER — ACETAMINOPHEN 500 MG PO TABS
1000.0000 mg | ORAL_TABLET | Freq: Once | ORAL | Status: AC
  Administered 2023-08-01: 1000 mg via ORAL

## 2023-08-01 MED ORDER — MIDAZOLAM HCL 2 MG/2ML IJ SOLN
INTRAMUSCULAR | Status: AC
  Filled 2023-08-01: qty 2

## 2023-08-01 MED ORDER — DEXAMETHASONE SODIUM PHOSPHATE 10 MG/ML IJ SOLN
INTRAMUSCULAR | Status: DC | PRN
  Administered 2023-08-01: 5 mg via INTRAVENOUS

## 2023-08-01 MED ORDER — LACTATED RINGERS IV SOLN: INTRAVENOUS | Status: DC

## 2023-08-01 MED ORDER — PROPOFOL 10 MG/ML IV BOLUS
INTRAVENOUS | Status: DC | PRN
Start: 2023-08-01 — End: 2023-08-01
  Administered 2023-08-01: 50 mg via INTRAVENOUS
  Administered 2023-08-01: 150 mg via INTRAVENOUS

## 2023-08-01 MED ORDER — FENTANYL CITRATE (PF) 100 MCG/2ML IJ SOLN
25.0000 ug | INTRAMUSCULAR | Status: DC | PRN
  Administered 2023-08-01: 25 ug via INTRAVENOUS
  Administered 2023-08-01: 50 ug via INTRAVENOUS

## 2023-08-01 MED ORDER — VANCOMYCIN HCL IN DEXTROSE 1-5 GM/200ML-% IV SOLN
INTRAVENOUS | Status: AC
  Filled 2023-08-01: qty 200

## 2023-08-01 MED ORDER — ROCURONIUM BROMIDE 100 MG/10ML IV SOLN
INTRAVENOUS | Status: DC | PRN
Start: 2023-08-01 — End: 2023-08-01
  Administered 2023-08-01: 20 mg via INTRAVENOUS
  Administered 2023-08-01: 50 mg via INTRAVENOUS

## 2023-08-01 MED ORDER — ONDANSETRON HCL 4 MG/2ML IJ SOLN
INTRAMUSCULAR | Status: DC | PRN
  Administered 2023-08-01: 4 mg via INTRAVENOUS

## 2023-08-01 MED ORDER — 0.9 % SODIUM CHLORIDE (POUR BTL) OPTIME
TOPICAL | Status: DC | PRN
  Administered 2023-08-01: 1000 mL

## 2023-08-01 MED ORDER — SUGAMMADEX SODIUM 200 MG/2ML IV SOLN
INTRAVENOUS | Status: DC | PRN
Start: 2023-08-01 — End: 2023-08-01
  Administered 2023-08-01: 250 mg via INTRAVENOUS

## 2023-08-01 MED ORDER — ACETAMINOPHEN 160 MG/5ML PO SOLN: 325.0000 mg | ORAL | Status: DC | PRN

## 2023-08-01 MED ORDER — OXYCODONE HCL 5 MG/5ML PO SOLN: 5.0000 mg | Freq: Once | ORAL | Status: AC | PRN

## 2023-08-01 MED ORDER — OXYCODONE HCL 5 MG PO TABS
ORAL_TABLET | ORAL | Status: AC
  Filled 2023-08-01: qty 1

## 2023-08-01 MED ORDER — CELECOXIB 200 MG PO CAPS
ORAL_CAPSULE | ORAL | Status: AC
  Filled 2023-08-01: qty 1

## 2023-08-01 MED ORDER — MIDAZOLAM HCL 2 MG/2ML IJ SOLN
INTRAMUSCULAR | Status: DC | PRN
  Administered 2023-08-01: 2 mg via INTRAVENOUS

## 2023-08-01 MED ORDER — SCOPOLAMINE 1 MG/3DAYS TD PT72
1.0000 | MEDICATED_PATCH | TRANSDERMAL | Status: DC
  Administered 2023-08-01: 1.5 mg via TRANSDERMAL

## 2023-08-01 SURGICAL SUPPLY — 78 items
ADH SKN CLS APL DERMABOND .7 (GAUZE/BANDAGES/DRESSINGS) ×8
APPLIER CLIP 9.375 MED OPEN (MISCELLANEOUS)
APR CLP MED 9.3 20 MLT OPN (MISCELLANEOUS)
BINDER ABDOMINAL 12 ML 46-62 (SOFTGOODS) IMPLANT
BINDER ABDOMINAL 12 SM 30-45 (SOFTGOODS) IMPLANT
BINDER BREAST 3XL (GAUZE/BANDAGES/DRESSINGS) IMPLANT
BINDER BREAST LRG (GAUZE/BANDAGES/DRESSINGS) IMPLANT
BINDER BREAST MEDIUM (GAUZE/BANDAGES/DRESSINGS) IMPLANT
BINDER BREAST XLRG (GAUZE/BANDAGES/DRESSINGS) ×1 IMPLANT
BINDER BREAST XXLRG (GAUZE/BANDAGES/DRESSINGS) IMPLANT
BIOPATCH RED 1 DISK 7.0 (GAUZE/BANDAGES/DRESSINGS) ×6 IMPLANT
BLADE CLIPPER SURG (BLADE) IMPLANT
BLADE SURG 10 STRL SS (BLADE) ×16 IMPLANT
BLADE SURG 11 STRL SS (BLADE) IMPLANT
BLADE SURG 15 STRL LF DISP TIS (BLADE) ×2 IMPLANT
BLADE SURG 15 STRL SS (BLADE) ×2
CANISTER SUCT 1200ML W/VALVE (MISCELLANEOUS) ×3 IMPLANT
CLIP APPLIE 9.375 MED OPEN (MISCELLANEOUS) IMPLANT
DERMABOND ADVANCED .7 DNX12 (GAUZE/BANDAGES/DRESSINGS) ×6 IMPLANT
DRAIN CHANNEL 19F RND (DRAIN) ×4 IMPLANT
DRAPE UTILITY XL STRL (DRAPES) ×3 IMPLANT
DRSG TEGADERM 4X4.75 (GAUZE/BANDAGES/DRESSINGS) ×4 IMPLANT
ELECT BLADE 4.0 EZ CLEAN MEGAD (MISCELLANEOUS) ×2
ELECT COATED BLADE 2.86 ST (ELECTRODE) IMPLANT
ELECT REM PT RETURN 9FT ADLT (ELECTROSURGICAL) ×4
ELECTRODE BLDE 4.0 EZ CLN MEGD (MISCELLANEOUS) ×2 IMPLANT
ELECTRODE REM PT RTRN 9FT ADLT (ELECTROSURGICAL) ×4 IMPLANT
EVACUATOR SILICONE 100CC (DRAIN) ×4 IMPLANT
GAUZE PAD ABD 8X10 STRL (GAUZE/BANDAGES/DRESSINGS) ×7 IMPLANT
GAUZE SPONGE 2X2 STRL 8-PLY (GAUZE/BANDAGES/DRESSINGS) ×4 IMPLANT
GLOVE BIO SURGEON STRL SZ 6.5 (GLOVE) IMPLANT
GLOVE BIO SURGEON STRL SZ7.5 (GLOVE) ×2 IMPLANT
GLOVE BIO SURGEON STRL SZ8 (GLOVE) ×2 IMPLANT
GLOVE BIOGEL M STRL SZ7.5 (GLOVE) ×1 IMPLANT
GLOVE BIOGEL PI IND STRL 7.0 (GLOVE) IMPLANT
GLOVE BIOGEL PI IND STRL 7.5 (GLOVE) ×1 IMPLANT
GLOVE BIOGEL PI IND STRL 8 (GLOVE) ×2 IMPLANT
GLOVE SURG SYN 7.5 E (GLOVE) ×2 IMPLANT
GLOVE SURG SYN 7.5 PF PI (GLOVE) IMPLANT
GOWN STRL REUS W/ TWL LRG LVL3 (GOWN DISPOSABLE) ×2 IMPLANT
GOWN STRL REUS W/ TWL XL LVL3 (GOWN DISPOSABLE) ×4 IMPLANT
GOWN STRL REUS W/TWL LRG LVL3 (GOWN DISPOSABLE)
GOWN STRL REUS W/TWL XL LVL3 (GOWN DISPOSABLE) ×7 IMPLANT
HEMOSTAT ARISTA ABSORB 3G PWDR (HEMOSTASIS) IMPLANT
HIBICLENS CHG 4% 4OZ BTL (MISCELLANEOUS) ×2 IMPLANT
MARKER SKIN DUAL TIP RULER LAB (MISCELLANEOUS) ×3 IMPLANT
NDL HYPO 25X1 1.5 SAFETY (NEEDLE) ×2 IMPLANT
NEEDLE HYPO 25X1 1.5 SAFETY (NEEDLE) ×2 IMPLANT
NS IRRIG 1000ML POUR BTL (IV SOLUTION) ×4 IMPLANT
PACK BASIN DAY SURGERY FS (CUSTOM PROCEDURE TRAY) ×2 IMPLANT
PACK UNIVERSAL I (CUSTOM PROCEDURE TRAY) ×3 IMPLANT
PENCIL SMOKE EVACUATOR (MISCELLANEOUS) ×6 IMPLANT
PIN SAFETY STERILE (MISCELLANEOUS) ×2 IMPLANT
SLEEVE SCD COMPRESS KNEE MED (STOCKING) ×2 IMPLANT
SPONGE T-LAP 18X18 ~~LOC~~+RFID (SPONGE) ×9 IMPLANT
STAPLER INSORB 30 2030 C-SECTI (MISCELLANEOUS) IMPLANT
STAPLER VISISTAT 35W (STAPLE) ×2 IMPLANT
SUT MNCRL AB 3-0 PS2 18 (SUTURE) ×8 IMPLANT
SUT MNCRL AB 3-0 PS2 27 (SUTURE) ×8 IMPLANT
SUT MNCRL AB 4-0 PS2 18 (SUTURE) ×12 IMPLANT
SUT MON AB 2-0 CT1 36 (SUTURE) ×3 IMPLANT
SUT MON AB 5-0 PS2 18 (SUTURE) IMPLANT
SUT SILK 2 0 PERMA HAND 18 BK (SUTURE) ×6 IMPLANT
SUT SILK 2 0 SH (SUTURE) ×4 IMPLANT
SUT VIC AB 2-0 CT1 27 (SUTURE) ×2
SUT VIC AB 2-0 CT1 TAPERPNT 27 (SUTURE) ×5 IMPLANT
SUT VIC AB 3-0 PS1 18 (SUTURE)
SUT VIC AB 3-0 PS1 18XBRD (SUTURE) IMPLANT
SUT VIC AB 3-0 SH 27 (SUTURE) ×4
SUT VIC AB 3-0 SH 27X BRD (SUTURE) ×2 IMPLANT
SUT VICRYL AB 3 0 TIES (SUTURE) IMPLANT
SYR BULB IRRIG 60ML STRL (SYRINGE) ×2 IMPLANT
SYR CONTROL 10ML LL (SYRINGE) ×3 IMPLANT
TOWEL GREEN STERILE FF (TOWEL DISPOSABLE) ×6 IMPLANT
TRAY DSU PREP LF (CUSTOM PROCEDURE TRAY) ×3 IMPLANT
TUBE CONNECTING 20X1/4 (TUBING) ×3 IMPLANT
UNDERPAD 30X36 HEAVY ABSORB (UNDERPADS AND DIAPERS) ×4 IMPLANT
YANKAUER SUCT BULB TIP NO VENT (SUCTIONS) ×3 IMPLANT

## 2023-08-01 NOTE — Anesthesia Procedure Notes (Signed)
Procedure Name: Intubation Date/Time: 08/01/2023 8:26 AM  Performed by: Karen Kitchens, CRNAPre-anesthesia Checklist: Patient identified, Emergency Drugs available, Suction available and Patient being monitored Patient Re-evaluated:Patient Re-evaluated prior to induction Oxygen Delivery Method: Circle system utilized Preoxygenation: Pre-oxygenation with 100% oxygen Induction Type: IV induction Ventilation: Mask ventilation without difficulty Laryngoscope Size: Mac and 4 Grade View: Grade I Tube type: Oral Tube size: 7.0 mm Number of attempts: 1 Airway Equipment and Method: Stylet and Oral airway Placement Confirmation: ETT inserted through vocal cords under direct vision, positive ETCO2, breath sounds checked- equal and bilateral and CO2 detector Secured at: 20 cm Tube secured with: Tape Dental Injury: Teeth and Oropharynx as per pre-operative assessment

## 2023-08-01 NOTE — Anesthesia Procedure Notes (Signed)
Procedure Name: LMA Insertion Date/Time: 08/01/2023 7:47 AM  Performed by: Karen Kitchens, CRNAPre-anesthesia Checklist: Patient identified, Emergency Drugs available, Suction available and Patient being monitored Patient Re-evaluated:Patient Re-evaluated prior to induction Oxygen Delivery Method: Circle system utilized Preoxygenation: Pre-oxygenation with 100% oxygen Induction Type: IV induction Ventilation: Mask ventilation without difficulty LMA: LMA inserted LMA Size: 4.0 Number of attempts: 1 Airway Equipment and Method: Bite block Placement Confirmation: positive ETCO2, CO2 detector and breath sounds checked- equal and bilateral Tube secured with: Tape Dental Injury: Teeth and Oropharynx as per pre-operative assessment

## 2023-08-01 NOTE — Interval H&P Note (Signed)
History and Physical Interval Note: No change in exam but patient has decided not to have the panniculectomy. She was marked for a breast reduction with her assistance. All questions answered to her satisfaction. Will proceed at her request.  08/01/2023 7:15 AM  Whitney Wilson  has presented today for surgery, with the diagnosis of Macromastia, panniculitis.  The various methods of treatment have been discussed with the patient and family. After consideration of risks, benefits and other options for treatment, the patient has consented to  Procedure(s): MAMMARY REDUCTION  (BREAST) (Bilateral) PANNICULECTOMY (N/A) as a surgical intervention.  The patient's history has been reviewed, patient examined, no change in status, stable for surgery.  I have reviewed the patient's chart and labs.  Questions were answered to the patient's satisfaction.     Santiago Glad

## 2023-08-01 NOTE — Transfer of Care (Signed)
Immediate Anesthesia Transfer of Care Note  Patient: Whitney Wilson  Procedure(s) Performed: BILATERAL MAMMARY REDUCTION  (BREAST) (Bilateral: Breast)  Patient Location: PACU  Anesthesia Type:General  Level of Consciousness: awake, drowsy, and patient cooperative  Airway & Oxygen Therapy: Patient Spontanous Breathing and Patient connected to face mask oxygen  Post-op Assessment: Report given to RN and Post -op Vital signs reviewed and stable  Post vital signs: Reviewed and stable  Last Vitals:  Vitals Value Taken Time  BP 126/76 08/01/23 1053  Temp    Pulse 90 08/01/23 1053  Resp 18 08/01/23 1053  SpO2 96 % 08/01/23 1053  Vitals shown include unfiled device data.  Last Pain:  Vitals:   08/01/23 0642  TempSrc: Oral  PainSc: 0-No pain      Patients Stated Pain Goal: 3 (08/01/23 2993)  Complications: No notable events documented.

## 2023-08-01 NOTE — Op Note (Signed)
DATE OF OPERATION: 08/01/2023  LOCATION: [facility] operating Room  PREOPERATIVE DIAGNOSIS: Macromastia  POSTOPERATIVE DIAGNOSIS: Same  PROCEDURE: Bilateral breast reduction  SURGEON: Loren Racer, MD  ASSISTANT: Matt Scheeler  EBL: 100 cc  CONDITION: Stable  COMPLICATIONS: None  INDICATION: The patient, Whitney Wilson, is a 32 y.o. female born on 12-Nov-1991, is here for treatment upper back and neck pain and general dissatisfaction with appearance of breasts.   PROCEDURE DETAILS:  The patient was seen prior to surgery and marked.   IV antibiotics were given. The patient was taken to the operating room and given a general anesthetic. A standard time out was performed and all information was confirmed by those in the room. SCDs were placed.   The chest was prepped and draped in usual sterile manner.  The nipple areolar complex was marked with a 42 mm cookie cutter and an 8 cm based inferior pedicle was outlined on each breast.  Right breast was addressed first.  Laparotomy tape was placed at the base of the breast as a tourniquet and the pedicle was de-epithelialized sharply.  The electrocautery was used to dissect the borders of the pedicle down to the chest wall.  The electrocautery was then used to resect the medial lateral and superior triangles of breast tissue as well as develop and thin the superior skin flap.  The breast tissue removed constituted the bulk of the reduction.  The total amount removed on the right side was 390 g.  This tissue was sent to pathology for routine examination.  After completing the reduction the surgical site was irrigated with warm saline and hemostasis achieved with the electrocautery.  The surgical bed was infiltrated with Exparel.  A 19 French round drain was placed behind the pedicle and brought out through a separate stab incision.  The T point was approximated with a single 2-0 Monocryl suture and the skin edges tailor tacked in place.  The dermis  was approximated with interrupted and running 3-0 Monocryl sutures and skin was closed with a running 4-0 Monocryl subcuticular stitch.  Attention was then turned to the left breast where the similar procedure was performed.  The laparotomy tape was placed at the base of the breast and the pedicle de-epithelialized and dissected to the chest wall.  The medial lateral and superior triangles of breast tissue were excised and the superior skin flap developed and thinned.  The total breast tissue removed on the left side was 476 g.  This was also sent to pathology for routine examination.  The surgical site was infiltrated with Exparel and irrigated.  Hemostasis was achieved with the electrocautery.  A 19 French round drain was placed behind the pedicle and brought out through a separate stab incision.  The T point was approximated with a 2-0 Monocryl.  Skin edges were tailor tacked in place with skin clips.  The dermis was approximated with interrupted and running 3-0 Monocryl sutures and the skin was closed with a running 4-0 Monocryl subcuticular stitch.  All incisions were sealed with Dermabond and the patient was placed in a supportive garment.  She was awakened from anesthesia without incident and transferred to the recovery room in good condition.  All instrument needle and sponge counts were reported as correct and there were no complications. The patient was allowed to wake up and taken to recovery room in stable condition at the end of the case. The family was notified at the end of the case.   The advanced  practice practitioner (APP) assisted throughout the case.  The APP was essential in retraction and counter traction when needed to make the case progress smoothly.  This retraction and assistance made it possible to see the tissue plans for the procedure.  The assistance was needed for blood control, tissue re-approximation and assisted with closure of the incision site.

## 2023-08-01 NOTE — Discharge Instructions (Addendum)
INSTRUCTIONS FOR AFTER BREAST SURGERY   You will likely have some questions about what to expect following your operation.  The following information will help you and your family understand what to expect when you are discharged from the hospital.  It is important to follow these guidelines to help ensure a smooth recovery and reduce complication.  Postoperative instructions include information on: diet, wound care, medications and physical activity.  AFTER SURGERY Expect to go home after the procedure.  In some cases, you may need to spend one night in the hospital for observation.  DIET Breast surgery does not require a specific diet.  However, the healthier you eat the better your body will heal. It is important to increasing your protein intake.  This means limiting the foods with sugar and carbohydrates.  Focus on vegetables and some meat.  If you have liposuction during your procedure be sure to drink water.  If your urine is bright yellow, then it is concentrated, and you need to drink more water.  As a general rule after surgery, you should have 8 ounces of water every hour while awake.  If you find you are persistently nauseated or unable to take in liquids let us know.  NO TOBACCO USE or EXPOSURE.  This will slow your healing process and lead to a wound.  WOUND CARE Leave the binder on at all times except when showering . Use fragrance free soap like Dial, Dove or [place].   After 24 hours you can remove the binder to shower. Once dry apply binder or sports bra. No baths, pools or hot tubs for four weeks. We close your incision to leave the smallest and best-looking scar. No ointment or creams on your incisions for four weeks.  No Neosporin (Too many skin reactions).  A few weeks after surgery you can use Mederma and start massaging the scar. We ask you to wear your binder or sports bra for the first 6 weeks around the clock, including while sleeping. This provides added comfort and helps  reduce the fluid accumulation at the surgery site. NO Ice or heating pads to the operative site.  You have a very high risk of a BURN before you feel the temperature change. Continue to empty, recharge, & record drainage from drains 2-3 times a day, as needed.  ACTIVITY No heavy lifting until cleared by the doctor.  This usually means no more than a half-gallon of milk.  It is OK to walk and climb stairs. Moving your legs is very important to decrease your risk of a blood clot.  It will also help keep you from getting deconditioned.  Every 1 to 2 hours get up and walk for 5 minutes. This will help with a quicker recovery back to normal.  Let pain be your guide so you don't do too much.  This time is for you to recover.  You will be more comfortable if you sleep and rest with your head elevated either with a few pillows under you or in a recliner.  No stomach sleeping for a three months.  WORK Everyone returns to work at different times. As a rough guide, most people take at least 1 - 2 weeks off prior to returning to work. If you need documentation for your job, give the forms to the front staff at the clinic.  DRIVING Arrange for someone to bring you home from the hospital after your surgery.  You may be able to drive a few days after  surgery but not while taking any narcotics or valium.  BOWEL MOVEMENTS Constipation can occur after anesthesia and while taking pain medication.  It is important to stay ahead for your comfort.  We recommend taking Milk of Magnesia (2 tablespoons; twice a day) while taking the pain pills.  MEDICATIONS You may be prescribed should start after surgery At your preoperative visit for you history and physical you may have been given the following medications: Zofran 4 mg:  This is to treat nausea and vomiting.  You can take this every 6 hours as needed and only if needed. Oxycodone 5 mg:  This is only to be used after you have taken the Motrin or the Tylenol. Every 8  hours as needed.   Over the counter Medication to take: Ibuprofen (Motrin) 600 mg:  Take this every 6 hours.  If you have additional pain then take 500 mg of the Tylenol every 8 hours.  Only take the Norco after you have tried these two. MiraLAX or Milk of Magnesia: Take this according to the bottle if you take the Norco.  WHEN TO CALL Call your surgeon's office if any of the following occur: Fever 101 degrees F or greater Excessive bleeding or fluid from the incision site. Pain that increases over time without aid from the medications Redness, warmth, or pus draining from incision sites Persistent nausea or inability to take in liquids Severe misshapen area that underwent the operation.   No Tylenol before 1:00pm if needed. No ibuprofen before 3:00pm if needed.  Post Anesthesia Home Care Instructions  Activity: Get plenty of rest for the remainder of the day. A responsible individual must stay with you for 24 hours following the procedure.  For the next 24 hours, DO NOT: -Drive a car -Advertising copywriter -Drink alcoholic beverages -Take any medication unless instructed by your physician -Make any legal decisions or sign important papers.  Meals: Start with liquid foods such as gelatin or soup. Progress to regular foods as tolerated. Avoid greasy, spicy, heavy foods. If nausea and/or vomiting occur, drink only clear liquids until the nausea and/or vomiting subsides. Call your physician if vomiting continues.  Special Instructions/Symptoms: Your throat may feel dry or sore from the anesthesia or the breathing tube placed in your throat during surgery. If this causes discomfort, gargle with warm salt water. The discomfort should disappear within 24 hours.  If you had a scopolamine patch placed behind your ear for the management of post- operative nausea and/or vomiting:  1. The medication in the patch is effective for 72 hours, after which it should be removed.  Wrap patch in a  tissue and discard in the trash. Wash hands thoroughly with soap and water. 2. You may remove the patch earlier than 72 hours if you experience unpleasant side effects which may include dry mouth, dizziness or visual disturbances. 3. Avoid touching the patch. Wash your hands with soap and water after contact with the patch.

## 2023-08-02 NOTE — Anesthesia Postprocedure Evaluation (Signed)
Anesthesia Post Note  Patient: Whitney Wilson  Procedure(s) Performed: BILATERAL MAMMARY REDUCTION  (BREAST) (Bilateral: Breast)     Patient location during evaluation: PACU Anesthesia Type: General Level of consciousness: awake and alert Pain management: pain level controlled Vital Signs Assessment: post-procedure vital signs reviewed and stable Respiratory status: spontaneous breathing, nonlabored ventilation, respiratory function stable and patient connected to nasal cannula oxygen Cardiovascular status: blood pressure returned to baseline and stable Postop Assessment: no apparent nausea or vomiting Anesthetic complications: no   No notable events documented.  Last Vitals:  Vitals:   08/01/23 1243 08/01/23 1330  BP: 119/88   Pulse: 82   Resp: 17   Temp: (!) 36.1 C   SpO2: 93% 94%    Last Pain:  Vitals:   08/01/23 1243  TempSrc:   PainSc: 3                  Anas Reister

## 2023-08-04 ENCOUNTER — Ambulatory Visit (INDEPENDENT_AMBULATORY_CARE_PROVIDER_SITE_OTHER): Payer: No Typology Code available for payment source | Admitting: Surgical

## 2023-08-04 ENCOUNTER — Encounter (HOSPITAL_BASED_OUTPATIENT_CLINIC_OR_DEPARTMENT_OTHER): Payer: Self-pay | Admitting: Plastic Surgery

## 2023-08-04 VITALS — BP 136/86 | HR 56

## 2023-08-04 DIAGNOSIS — N62 Hypertrophy of breast: Secondary | ICD-10-CM

## 2023-08-04 DIAGNOSIS — Z719 Counseling, unspecified: Secondary | ICD-10-CM

## 2023-08-04 NOTE — Progress Notes (Signed)
Patient is a 32 year old female here for follow-up after bilateral breast reduction on 08/01/2023 with Dr. Ladona Ridgel.  She is 3 days postop.  She reports she is doing well today, does report some discomfort, but reports she woke up last night sleeping with her arms above her head and has noticed some slightly increased tenderness due to this.  She is otherwise doing well.   Drain output has been minimal  Chaperone present on exam On exam bilateral NAC's are viable, bilateral breast incisions are intact and are healing well.  Bilateral JP drains in place with scant amount of serous drainage noted in each bulb.  There is no erythema or cellulitic changes noted of either breast, she does have some resolving ecchymosis noted.  No subcutaneous fluid collection noted with palpation  A/P:  Continue with compressive garments, avoid strenuous activities or heavy lifting. Recommend following up in 1 week for reevaluation. Bilateral JP drains were removed, patient tolerated this well.

## 2023-08-06 ENCOUNTER — Encounter: Payer: PRIVATE HEALTH INSURANCE | Admitting: Surgical

## 2023-08-07 ENCOUNTER — Encounter: Payer: PRIVATE HEALTH INSURANCE | Admitting: Plastic Surgery

## 2023-08-12 NOTE — Progress Notes (Unsigned)
Patient is a 32 year old female here for follow-up after bilateral breast reduction with Dr. Ladona Ridgel on 08/01/2023.  She is 12 days postop.  She reports overall she is feeling well, continues to have some discomfort in her bilateral breasts.  She also reports some nervelike pain in bilateral breast.  She is not having any infectious symptoms.  She reports she is having tenderness which is causing her difficulty with raising her arms, pain with driving.  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  Chaperone present on exam On exam bilateral NAC's are viable, bilateral breast incisions are intact and healing well.  There is no erythema or cellulitic changes.  No subcutaneous fluid collection noted.  Bilateral breasts are symmetric.   A/P:  Continue with compressive garments, which strenuous activities or heavy lifting.  We discussed that the pain she is experiencing is normal and should continue to improve over the next week or so.  We discussed continuing with Tylenol, can try some Toradol instead of ibuprofen.  Discussed with patient that she should not use the Toradol and ibuprofen within the same 24-hour period.  Patient was agreeable to trying Toradol and Tylenol only.  We will provide her with a letter stating she can return to work tomorrow, but recommend working from home for approximately 2 weeks.  She can return to the office on September 9.

## 2023-08-13 ENCOUNTER — Encounter: Payer: Self-pay | Admitting: Surgical

## 2023-08-13 ENCOUNTER — Ambulatory Visit (INDEPENDENT_AMBULATORY_CARE_PROVIDER_SITE_OTHER): Payer: No Typology Code available for payment source | Admitting: Surgical

## 2023-08-13 DIAGNOSIS — N62 Hypertrophy of breast: Secondary | ICD-10-CM

## 2023-08-13 MED ORDER — KETOROLAC TROMETHAMINE 10 MG PO TABS: 10.0000 mg | ORAL_TABLET | Freq: Four times a day (QID) | ORAL | 0 refills | Status: AC | PRN

## 2023-08-14 ENCOUNTER — Encounter: Payer: Self-pay | Admitting: Student

## 2023-08-19 ENCOUNTER — Telehealth: Payer: Self-pay | Admitting: Surgical

## 2023-08-19 NOTE — Telephone Encounter (Signed)
Pt called requesting to speak with Herbert Seta, no further details were given.

## 2023-08-20 NOTE — Telephone Encounter (Signed)
Completed.

## 2023-08-28 ENCOUNTER — Ambulatory Visit (INDEPENDENT_AMBULATORY_CARE_PROVIDER_SITE_OTHER): Payer: No Typology Code available for payment source | Admitting: Surgical

## 2023-08-28 DIAGNOSIS — N62 Hypertrophy of breast: Secondary | ICD-10-CM

## 2023-08-28 NOTE — Progress Notes (Signed)
Patient is a 32 year old female here for follow-up after bilateral breast reduction with Dr. Ladona Ridgel on 08/01/2023.  She was last seen in the office on 08/13/2023.  She is just shy of 4 weeks postop.  Patient reports she is overall doing well, however reports she continues to have tenderness of the right lateral breast.  She reports that she is right-handed.  She is not having any issues with the left side.  Chaperone present on exam On exam bilateral NAC's are viable, bilateral breast incisions are intact and healing well.  She does have a suture protruding through the skin on the right lateral breast.  There is some thickening of the scar noted with palpation of the right lateral and left lateral breast.  I do not appreciate any subcutaneous fluid collections or tenderness with palpation on exam.  A/P:  Patient is overall doing well after bilateral breast reduction surgery 4 weeks ago, she continues to endorse some pain in the right lateral breast.  We discussed recommendations for massage to this area to help with desensitization.  We discussed the use of a mild lotion to help with the Dermabond coming off.  We also discussed starting the use of scar creams once the Dermabond has been removed.  Recommend continue with compressive garments and avoid strenuous activities or heavy lifting for 2 more weeks.  Recommend following up in 2 to 3 weeks for reevaluation.  Pictures were obtained of the patient and placed in the chart with the patient's or guardian's permission.

## 2023-09-05 ENCOUNTER — Telehealth: Payer: Self-pay | Admitting: Surgical

## 2023-09-05 NOTE — Telephone Encounter (Signed)
Patient is requesting call from [facility] following sx. She did not leave further details.

## 2023-09-08 ENCOUNTER — Encounter: Payer: Self-pay | Admitting: Surgical

## 2023-09-08 ENCOUNTER — Encounter: Payer: Self-pay | Admitting: Plastic Surgery

## 2023-09-08 NOTE — Telephone Encounter (Signed)
Please provide patient with letter stating she can work from home until 09/12/2023 and can return to office on 09/15/23 with no restrictions.  Please notify patient as well if you don't mind

## 2023-09-08 NOTE — Telephone Encounter (Signed)
I asked the front, may be able to check with them if they sent a letter to the patient

## 2023-09-09 ENCOUNTER — Encounter: Payer: Self-pay | Admitting: Plastic Surgery

## 2023-09-17 ENCOUNTER — Encounter: Payer: Self-pay | Admitting: Surgical

## 2023-09-17 ENCOUNTER — Ambulatory Visit (INDEPENDENT_AMBULATORY_CARE_PROVIDER_SITE_OTHER): Payer: No Typology Code available for payment source | Admitting: Surgical

## 2023-09-17 VITALS — BP 129/87 | HR 50

## 2023-09-17 DIAGNOSIS — N62 Hypertrophy of breast: Secondary | ICD-10-CM

## 2023-09-17 NOTE — Progress Notes (Signed)
Patient is a 32 year old female here for follow-up after bilateral breast reduction with Dr. Ladona Ridgel on 08/01/2023.  She is 6 and half weeks postop.  She reports overall she is doing well, she reports ongoing tenderness in the lateral breast near the JP drain insertion site.  She reports otherwise she is not having any issues.  She does report that her breasts are slightly smaller than she anticipated.  She is not having any infectious symptoms.  She has returned to work and feels as if this is going well, does report some discomfort when sitting in office chair without unsupported arms.  Chaperone present on exam On exam breast incisions are intact, NAC's are viable, no subcutaneous fluid collection noted.  No wounds or areas of concern noted.  No erythema or cellulitic changes.  A/P:  Recommend following up as needed, no more restrictions at this time.  Increase activity as tolerated. Recommend calling with any questions or concerns.

## 2024-02-01 IMAGING — CT CT ABD-PELV W/ CM
2 of 4 series · 15 of 46 positions shown, 17 images · IV contrast (Omnipaque)
Comparison: 05/30/2014

CLINICAL DATA: Right lower quadrant abdominal pain for 3 days

EXAM:
CT ABDOMEN AND PELVIS WITH CONTRAST
TECHNIQUE: Multidetector CT imaging of the abdomen and pelvis was performed
using the standard protocol following bolus administration of
intravenous contrast.

[Series 2: axial st · axial · 0.85mm/px · z∈[-477,-77]mm · 12 of 96 slices shown, 14 images]
[im 8/96  soft-tissue]
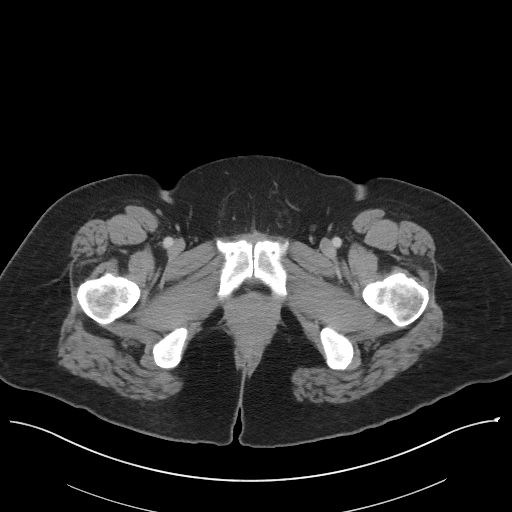
[im 8/96  bone]
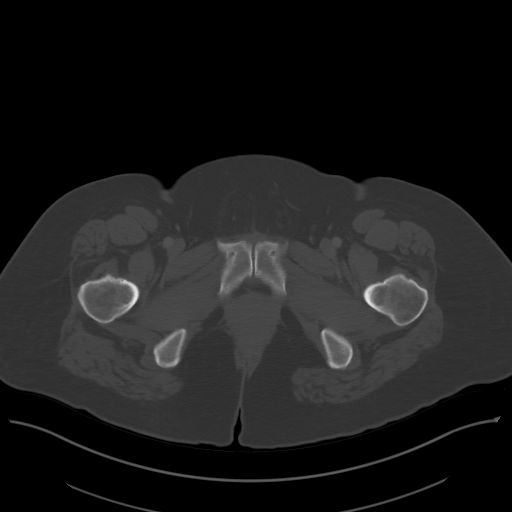
[im 15/96  soft-tissue]
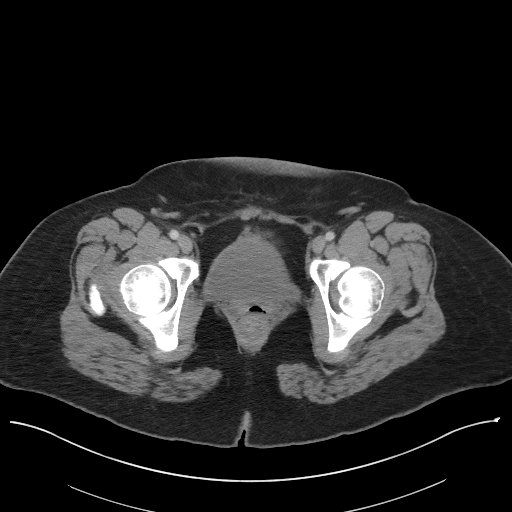
[im 22/96  soft-tissue]
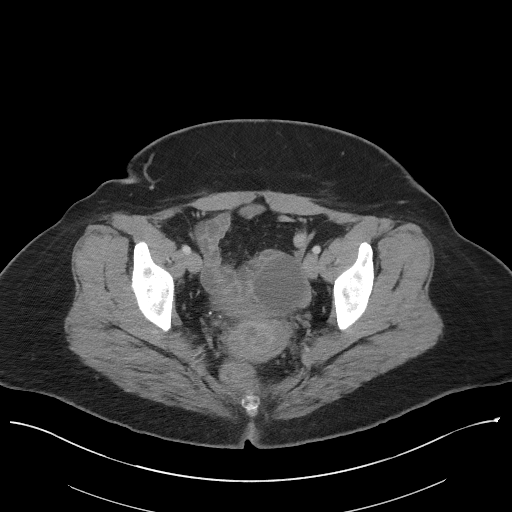
[im 30/96  soft-tissue]
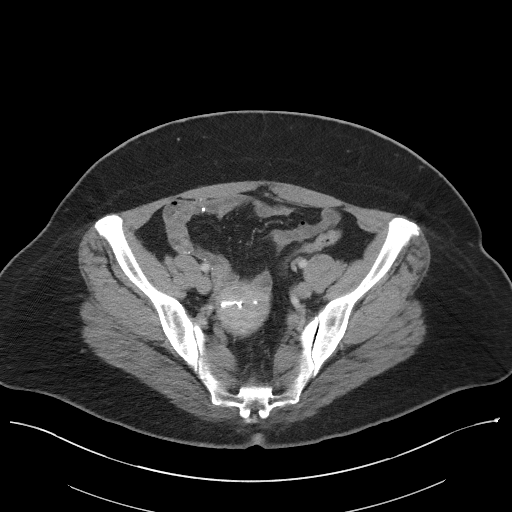
[im 37/96  soft-tissue]
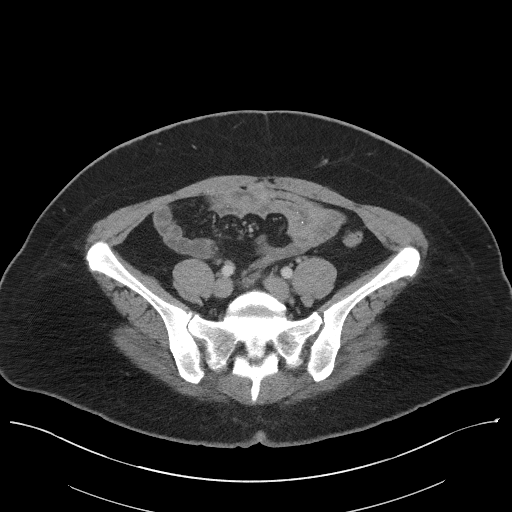
[im 44/96  soft-tissue]
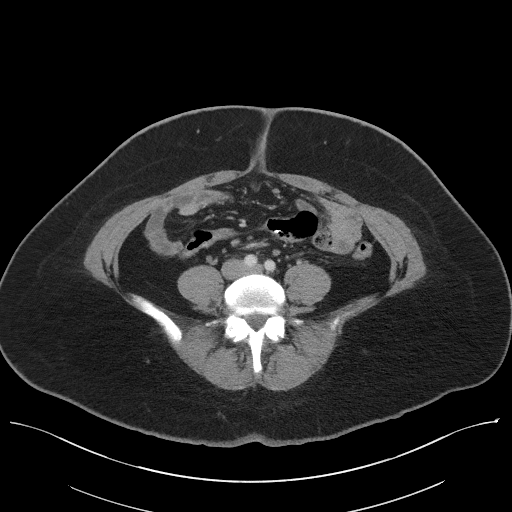
[im 52/96  soft-tissue]
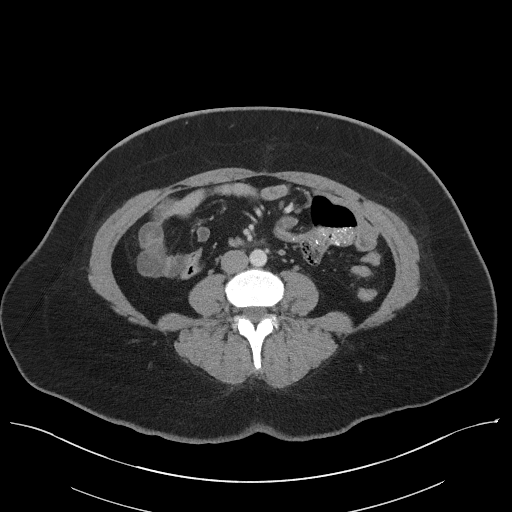
[im 59/96  soft-tissue]
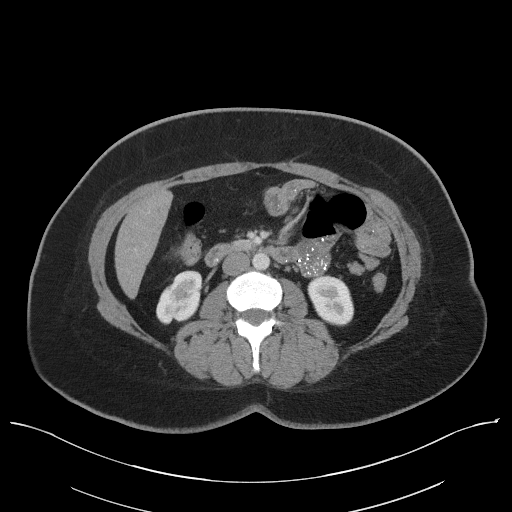
[im 66/96  soft-tissue]
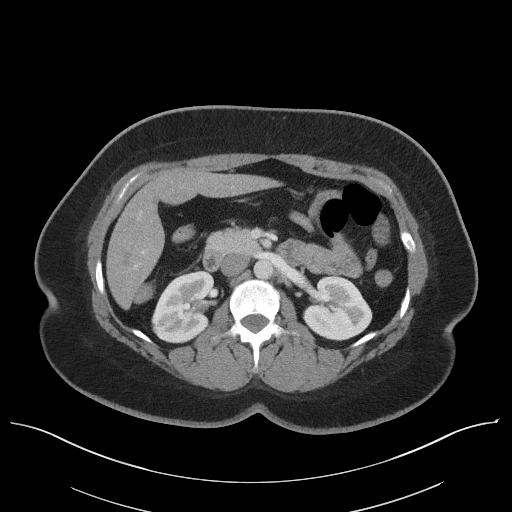
[im 66/96  bone]
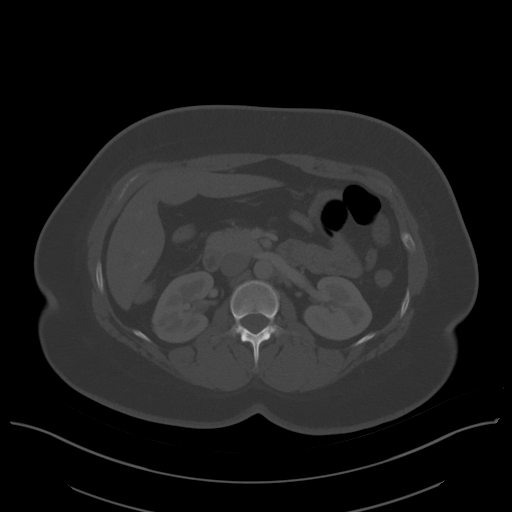
[im 74/96  soft-tissue]
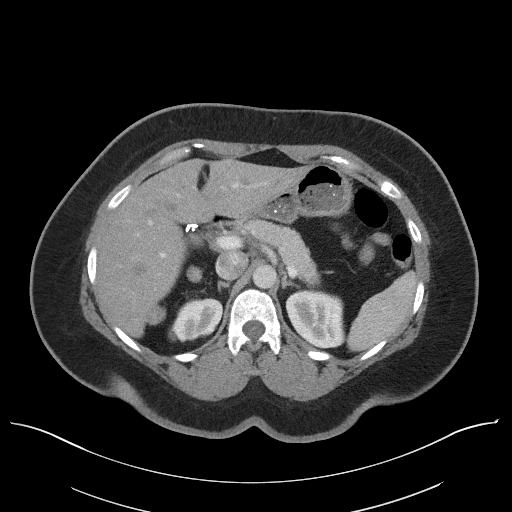
[im 81/96  soft-tissue]
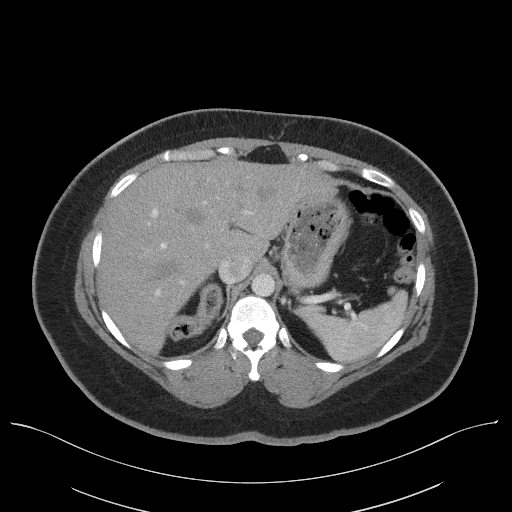
[im 88/96  soft-tissue]
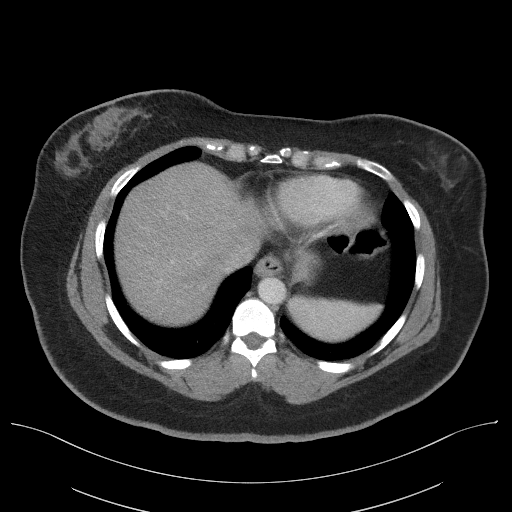

[Series 5: coronal st · coronal · 0.82mm/px · 3 of 106 slices shown]
[im 36/106  soft-tissue]
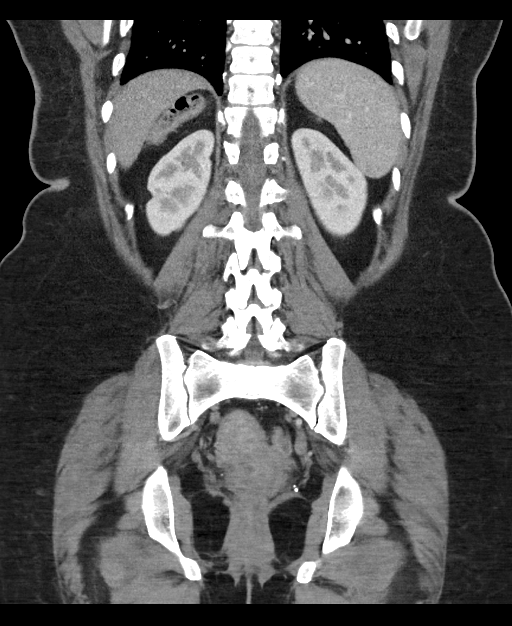
[im 47/106  soft-tissue]
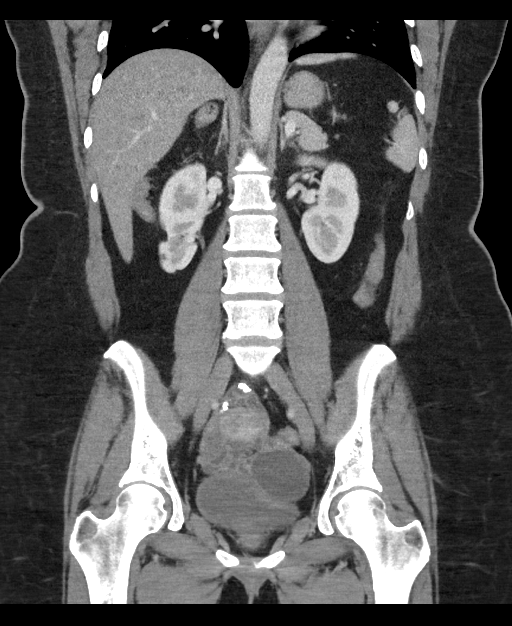
[im 59/106  soft-tissue]
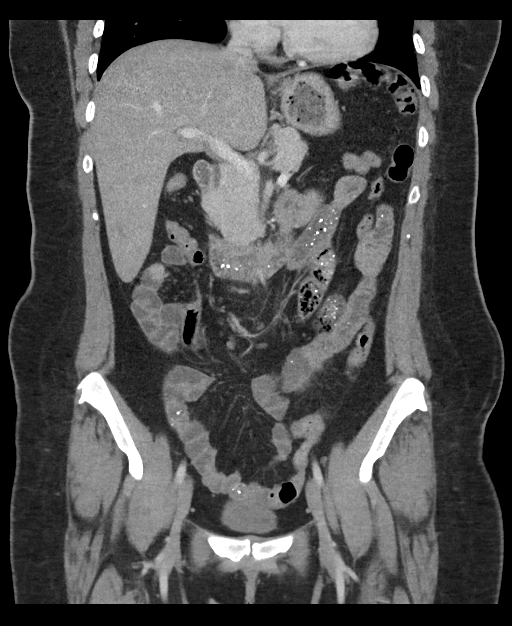

[15 of 46 positions shown; findings below may reference images not displayed]

RADIATION DOSE REDUCTION: This exam was performed according to the
departmental dose-optimization program which includes automated
exposure control, adjustment of the mA and/or kV according to
patient size and/or use of iterative reconstruction technique.

CONTRAST:  100mL OMNIPAQUE IOHEXOL 300 MG/ML  SOLN
FINDINGS: Lower chest: Included lung bases are clear. Heart size within normal
limits.

Hepatobiliary: No focal liver abnormality is seen. Status post
cholecystectomy. No biliary dilatation.

Pancreas: Unremarkable. No pancreatic ductal dilatation or
surrounding inflammatory changes.

Spleen: Normal in size without focal abnormality.

Adrenals/Urinary Tract: Unremarkable adrenal glands. Chronic areas
of cortical scarring within the right kidney. Kidneys enhance
symmetrically without focal lesion, stone, or hydronephrosis.
Ureters are nondilated. Urinary bladder appears unremarkable.

Stomach/Bowel: Stomach within normal limits. No dilated loops of
bowel. Appropriate positioning of the ligament of Treitz. Cecum is
located within the mid abdomen, left of midline. A normal appendix
is present within the mid abdomen near the midline (series 2, image
47). No focal bowel wall thickening or inflammatory changes.

Vascular/Lymphatic: No significant vascular abnormality is seen. No
significant twist of the mesenteric vessels. There are scattered
nonenlarged central mesenteric lymph nodes. No abdominopelvic
lymphadenopathy.

Reproductive: IUD within the uterus. Left adnexal cyst measuring up
to 4.9 cm. No right adnexal abnormality.

Other: No free fluid. No abdominopelvic fluid collection. No
pneumoperitoneum. No abdominal wall hernia.

Musculoskeletal: No acute or significant osseous findings.
IMPRESSION: 1. No acute abdominopelvic findings.  Normal appendix.
2. Findings suggestive of a mobile cecum. Cecum is located within
the mid abdomen, left of midline and was previously seen within the
right lower quadrant on CT 05/30/2014. Given the history of right
lower quadrant abdominal pain, mobile cecum syndrome is a
consideration. There are no findings to suggest volvulus,
obstruction, or bowel inflammation.
3. Left adnexal cyst measuring up to 4.9 cm. No follow-up imaging is
recommended.
Reference: JACR [DATE]):[id number]

## 2024-02-24 ENCOUNTER — Telehealth: Payer: Self-pay | Admitting: Physician Assistant

## 2024-02-24 DIAGNOSIS — J208 Acute bronchitis due to other specified organisms: Secondary | ICD-10-CM

## 2024-02-24 MED ORDER — PSEUDOEPH-BROMPHEN-DM 30-2-10 MG/5ML PO SYRP: 5.0000 mL | ORAL_SOLUTION | Freq: Four times a day (QID) | ORAL | 0 refills | Status: DC | PRN

## 2024-02-24 MED ORDER — PREDNISONE 20 MG PO TABS
40.0000 mg | ORAL_TABLET | Freq: Every day | ORAL | 0 refills | Status: DC
Start: 2024-02-24 — End: 2024-05-29

## 2024-02-24 MED ORDER — ALBUTEROL SULFATE HFA 108 (90 BASE) MCG/ACT IN AERS
1.0000 | INHALATION_SPRAY | Freq: Four times a day (QID) | RESPIRATORY_TRACT | 0 refills | Status: DC | PRN
Start: 2024-02-24 — End: 2024-05-29

## 2024-02-24 NOTE — Progress Notes (Signed)
 E-Visit for Cough   We are sorry that you are not feeling well.  Here is how we plan to help!  Based on your presentation I believe you most likely have A cough due to a virus.  This is called viral bronchitis and is best treated by rest, plenty of fluids and control of the cough.  You may use Ibuprofen or Tylenol as directed to help your symptoms.     In addition you may use Bromfed DM cough syrup Take 5mL every 6 hours as needed for cough.  I have also prescribed Prednisone 20mg  Take 2 tablets (40mg ) daily for 5 days.  AND  Albuterol inhaler Use 1-2 puffs every 6 hours as needed for shortness of breath, chest tightness, and/or wheezing.  From your responses in the eVisit questionnaire you describe inflammation in the upper respiratory tract which is causing a significant cough.  This is commonly called Bronchitis and has four common causes:   Allergies Viral Infections Acid Reflux Bacterial Infection Allergies, viruses and acid reflux are treated by controlling symptoms or eliminating the cause. An example might be a cough caused by taking certain blood pressure medications. You stop the cough by changing the medication. Another example might be a cough caused by acid reflux. Controlling the reflux helps control the cough.  USE OF BRONCHODILATOR ("RESCUE") INHALERS: There is a risk from using your bronchodilator too frequently.  The risk is that over-reliance on a medication which only relaxes the muscles surrounding the breathing tubes can reduce the effectiveness of medications prescribed to reduce swelling and congestion of the tubes themselves.  Although you feel brief relief from the bronchodilator inhaler, your asthma may actually be worsening with the tubes becoming more swollen and filled with mucus.  This can delay other crucial treatments, such as oral steroid medications. If you need to use a bronchodilator inhaler daily, several times per day, you should discuss this with your  provider.  There are probably better treatments that could be used to keep your asthma under control.     HOME CARE Only take medications as instructed by your medical team. Complete the entire course of an antibiotic. Drink plenty of fluids and get plenty of rest. Avoid close contacts especially the very young and the elderly Cover your mouth if you cough or cough into your sleeve. Always remember to wash your hands A steam or ultrasonic humidifier can help congestion.   GET HELP RIGHT AWAY IF: You develop worsening fever. You become short of breath You cough up blood. Your symptoms persist after you have completed your treatment plan MAKE SURE YOU  Understand these instructions. Will watch your condition. Will get help right away if you are not doing well or get worse.    Thank you for choosing an e-visit.  Your e-visit answers were reviewed by a board certified advanced clinical practitioner to complete your personal care plan. Depending upon the condition, your plan could have included both over the counter or prescription medications.  Please review your pharmacy choice. Make sure the pharmacy is open so you can pick up prescription now. If there is a problem, you may contact your provider through [employer] and have the prescription routed to another pharmacy.  Your safety is important to [place]. If you have drug allergies check your prescription carefully.   For the next 24 hours you can use MyChart to ask questions about today's visit, request a non-urgent call back, or ask for a work or school excuse.  You will get an email in the next two days asking about your experience. I hope that your e-visit has been valuable and will speed your recovery.   I have spent 5 minutes in review of e-visit questionnaire, review and updating patient chart, medical decision making and response to patient.   Margaretann Loveless, PA-C

## 2024-05-29 ENCOUNTER — Telehealth: Admitting: Physician Assistant

## 2024-05-29 DIAGNOSIS — J069 Acute upper respiratory infection, unspecified: Secondary | ICD-10-CM | POA: Diagnosis not present

## 2024-05-29 NOTE — Progress Notes (Signed)
 E-Visit for Upper Respiratory Infection  ? ?We are sorry you are not feeling well.  Here is how we plan to help! ? ?Based on what you have shared with me, it looks like you may have a viral upper respiratory infection.  Upper respiratory infections are caused by a large number of viruses; however, rhinovirus is the most common cause.  ? ?Symptoms vary from person to person, with common symptoms including sore throat, cough, fatigue or lack of energy and feeling of general discomfort.  A low-grade fever of up to 100.4 may present, but is often uncommon.  Symptoms vary however, and are closely related to a person's age or underlying illnesses.  The most common symptoms associated with an upper respiratory infection are nasal discharge or congestion, cough, sneezing, headache and pressure in the ears and face.  These symptoms usually persist for about 3 to 10 days, but can last up to 2 weeks.  It is important to know that upper respiratory infections do not cause serious illness or complications in most cases.   ? ?Upper respiratory infections can be transmitted from person to person, with the most common method of transmission being a person's hands.  The virus is able to live on the skin and can infect other persons for up to 2 hours after direct contact.  Also, these can be transmitted when someone coughs or sneezes; thus, it is important to cover the mouth to reduce this risk.  To keep the spread of the illness at bay, good hand hygiene is very important. ? ?This is an infection that is most likely caused by a virus. There are no specific treatments other than to help you with the symptoms until the infection runs its course.  We are sorry you are not feeling well.  Here is how we plan to help! ? ? ?For nasal congestion, you may use an oral decongestants such as Mucinex D or if you have glaucoma or high blood pressure use plain Mucinex.  Saline nasal spray or nasal drops can help and can safely be used as often as  needed for congestion.  ? ?If you do not have a history of heart disease, hypertension, diabetes or thyroid disease, prostate/bladder issues or glaucoma, you may also use Sudafed to treat nasal congestion.  It is highly recommended that you consult with a pharmacist or your primary care physician to ensure this medication is safe for you to take.    ? ?If you have a cough, you may use cough suppressants such as Delsym and Robitussin.  If you have glaucoma or high blood pressure, you can also use Coricidin HBP.   ? ? ?If you have a sore or scratchy throat, use a saltwater gargle- ? to ? teaspoon of salt dissolved in a 4-ounce to 8-ounce glass of warm water.  Gargle the solution for approximately 15-30 seconds and then spit.  It is important not to swallow the solution.  You can also use throat lozenges/cough drops and Chloraseptic spray to help with throat pain or discomfort.  Warm or cold liquids can also be helpful in relieving throat pain. ? ?For headache, pain or general discomfort, you can use Ibuprofen or Tylenol as directed.   ?Some authorities believe that zinc sprays or the use of Echinacea may shorten the course of your symptoms. ? ? ?HOME CARE ?Only take medications as instructed by your medical team. ?Be sure to drink plenty of fluids. Water is fine as well as fruit juices, sodas  and electrolyte beverages. You may want to stay away from caffeine or alcohol. If you are nauseated, try taking small sips of liquids. How do you know if you are getting enough fluid? Your urine should be a pale yellow or almost colorless. ?Get rest. ?Taking a steamy shower or using a humidifier may help nasal congestion and ease sore throat pain. You can place a towel over your head and breathe in the steam from hot water coming from a faucet. ?Using a saline nasal spray works much the same way. ?Cough drops, hard candies and sore throat lozenges may ease your cough. ?Avoid close contacts especially the very young and the  elderly ?Cover your mouth if you cough or sneeze ?Always remember to wash your hands.  ? ?GET HELP RIGHT AWAY IF: ?You develop worsening fever. ?If your symptoms do not improve within 10 days ?You develop yellow or green discharge from your nose over 3 days. ?You have coughing fits ?You develop a severe head ache or visual changes. ?You develop shortness of breath, difficulty breathing or start having chest pain ?Your symptoms persist after you have completed your treatment plan ? ?MAKE SURE YOU  ?Understand these instructions. ?Will watch your condition. ?Will get help right away if you are not doing well or get worse. ? ?Thank you for choosing an e-visit. ? ?Your e-visit answers were reviewed by a board certified advanced clinical practitioner to complete your personal care plan. Depending upon the condition, your plan could have included both over the counter or prescription medications. ? ?Please review your pharmacy choice. Make sure the pharmacy is open so you can pick up prescription now. If there is a problem, you may contact your provider through [employer] and have the prescription routed to another pharmacy.  Your safety is important to [place]. If you have drug allergies check your prescription carefully.  ? ?For the next 24 hours you can use MyChart to ask questions about today's visit, request a non-urgent call back, or ask for a work or school excuse. ?You will get an email in the next two days asking about your experience. I hope that your e-visit has been valuable and will speed your recovery. ? ? ? ? ?

## 2024-05-29 NOTE — Progress Notes (Signed)
 I have spent 5 minutes in review of e-visit questionnaire, review and updating patient chart, medical decision making and response to patient.   Piedad Climes, PA-C

## 2024-06-10 ENCOUNTER — Telehealth: Admitting: Physician Assistant

## 2024-06-10 DIAGNOSIS — H60392 Other infective otitis externa, left ear: Secondary | ICD-10-CM | POA: Diagnosis not present

## 2024-06-10 MED ORDER — CIPROFLOXACIN-DEXAMETHASONE 0.3-0.1 % OT SUSP: OTIC | 0 refills | Status: DC

## 2024-06-10 NOTE — Progress Notes (Signed)
 I have spent 5 minutes in review of e-visit questionnaire, review and updating patient chart, medical decision making and response to patient.   Piedad Climes, PA-C

## 2024-06-10 NOTE — Progress Notes (Signed)
 E Visit for Ear Pain - Swimmer's Ear  We are sorry that you are not feeling well. Here is how we plan to help!  Based on what you have shared with me it looks like you have Swimmer's Ear.  Swimmer's ear is a redness or swelling, irritation, or infection of your outer ear canal. These symptoms usually occur within a few days of swimming. Your ear canal is a tube that goes from the opening of the ear to the eardrum.  When water stays in your ear canal, germs can grow.  This is a painful condition that often happens to children and swimmers of all ages.  It is not contagious and oral antibiotics are not required to treat uncomplicated swimmer's ear.  The usual symptoms include:    Itchiness inside the ear  Redness or a sense of swelling in the ear  Pain when the ear is tugged on when pressure is placed on the ear  Pus draining from the infected ear   I have prescribed Ciprodex drops to apply to the affected ear as directed for 7 days.  In certain cases, swimmer's ear may progress to a more serious bacterial infection of the middle or inner ear.  If you have a fever 102 and up and significantly worsening symptoms, this could indicate a more serious infection moving to the middle/inner and needs face to face evaluation in an office by a provider.  Your symptoms should improve over the next 3 days and should resolve in about 7 days.  Be sure to complete ALL of your prescription.  HOME CARE: Wash your hands frequently. If you are prescribed an ear drop, do not place the tip of the bottle on your ear or touch it with your fingers. You can take Acetaminophen  650 mg every 4-6 hours as needed for pain.  If pain is severe or moderate, you can apply a heating pad (set on low) or hot water bottle (wrapped in a towel) to outer ear for 20 minutes.  This will also increase drainage. Avoid ear plugs Do not go swimming until the symptoms are gone Do not use Q-tips After showers, help the water run out by  tilting your head to one side.   GET HELP RIGHT AWAY IF: Fever is over 102.2 degrees. You develop progressive ear pain or hearing loss. Ear symptoms persist longer than 3 days after treatment.  MAKE SURE YOU: Understand these instructions. Will watch your condition. Will get help right away if you are not doing well or get worse.  TO PREVENT SWIMMER'S EAR: Use a bathing cap or custom fitted swim molds to keep your ears dry. Towel off after swimming to dry your ears. Tilt your head or pull your earlobes to allow the water to escape your ear canal. If there is still water in your ears, consider using a hairdryer on the lowest setting.  Thank you for choosing an e-visit.  Your e-visit answers were reviewed by a board certified advanced clinical practitioner to complete your personal care plan. Depending upon the condition, your plan could have included both over the counter or prescription medications.  Please review your pharmacy choice. Make sure the pharmacy is open so you can pick up the prescription now. If there is a problem, you may contact your provider through [employer] and have the prescription routed to another pharmacy.  Your safety is important to us . If you have drug allergies check your prescription carefully.   For the next 24  hours you can use MyChart to ask questions about today's visit, request a non-urgent call back, or ask for a work or school excuse. You will get an email with a survey after your eVisit asking about your experience. We would appreciate your feedback. I hope that your e-visit has been valuable and will aid in your recovery.

## 2024-08-09 ENCOUNTER — Telehealth: Admitting: Physician Assistant

## 2024-08-09 DIAGNOSIS — M79602 Pain in left arm: Secondary | ICD-10-CM

## 2024-08-09 DIAGNOSIS — R079 Chest pain, unspecified: Secondary | ICD-10-CM
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
## 2024-08-09 NOTE — Patient Instructions (Addendum)
  Whitney Wilson, thank you for joining Teena Shuck, PA-C for today's virtual visit.  While this provider is not your primary care provider (PCP), if your PCP is located in our provider database this encounter information will be shared with them immediately following your visit.   A [facility] MyChart account gives you access to today's visit and all your visits, tests, and labs performed at [facility]  click here if you don't have a [facility] MyChart account or go to [email or web address]  Consent: (Patient) Whitney Wilson provided verbal consent for this virtual visit at the beginning of the encounter.  Current Medications:  Current Outpatient Medications:    ciprofloxacin -dexamethasone  (CIPRODEX ) OTIC suspension, Apply 4 drops to the affected ear(s) twice daily x 7 days, Disp: 7.5 mL, Rfl: 0   levonorgestrel (MIRENA, 52 MG,) 20 MCG/DAY IUD, 1 each by Intrauterine route once., Disp: , Rfl:    Medications ordered in this encounter:  No orders of the defined types were placed in this encounter.    *If you need refills on other medications prior to your next appointment, please contact your pharmacy*  Follow-Up: Call back or seek an in-person evaluation if the symptoms worsen or if the condition fails to improve as anticipated.  [facility] [phone]  Other Instructions Please report to the nearest Emergency room with any worsening symptoms. Follow up with primary care provider (PCP) in 2 -3 days.   If you have been instructed to have an in-person evaluation today at a local Urgent Care facility, please use the link below. It will take you to a list of all of our available [facility], including address, phone number and hours of operation. Please do not delay care.  [facility]  If you or a family member do not have a primary care provider, use the link below to schedule a visit and establish care. When you choose  a [facility] primary care physician or advanced practice provider, you gain a long-term partner in health. Find a Primary Care Provider  Learn more about [facility]'s in-office and virtual care options:  - Get Care Now

## 2024-08-10 ENCOUNTER — Ambulatory Visit (INDEPENDENT_AMBULATORY_CARE_PROVIDER_SITE_OTHER): Admitting: Radiology

## 2024-08-10 ENCOUNTER — Other Ambulatory Visit: Payer: Self-pay

## 2024-08-10 ENCOUNTER — Ambulatory Visit: Admission: RE | Admit: 2024-08-10 | Discharge: 2024-08-10 | Disposition: A | Discharge status: Home / Self Care

## 2024-08-10 VITALS — BP 129/90 | HR 70 | Temp 98.1°F | Resp 18 | Ht 64.0 in | Wt 190.0 lb

## 2024-08-10 DIAGNOSIS — M25512 Pain in left shoulder: Secondary | ICD-10-CM

## 2024-08-10 MED ORDER — KETOROLAC TROMETHAMINE 30 MG/ML IJ SOLN
30.0000 mg | Freq: Once | INTRAMUSCULAR | Status: AC
  Administered 2024-08-10: 30 mg via INTRAMUSCULAR
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
## 2024-08-10 NOTE — Discharge Instructions (Addendum)
 You are seen today for concerns of left shoulder pain.  At this time I suspect you have likely sustained a muscular injury with some mild swelling causing your discomfort.  We have provided you with a Toradol  30 mg injection to assist with pain and inflammation.  This medication is an NSAID so for the next 2 days I do not recommend taking other NSAID medications.  You can use Tylenol  for further pain control as needed. I have included some stretches in your paperwork for you to complete to help prevent stiffness  You can also use warm compresses, gentle massage, lidocaine  patches as needed for further relief. If you feel like your symptoms are not improving or seem to be worsening I recommend follow-up with orthopedics for further evaluation.

## 2024-08-14 ENCOUNTER — Telehealth: Admitting: Nurse Practitioner

## 2024-08-14 ENCOUNTER — Telehealth

## 2024-08-14 DIAGNOSIS — J029 Acute pharyngitis, unspecified: Secondary | ICD-10-CM | POA: Diagnosis not present

## 2024-08-14 DIAGNOSIS — J020 Streptococcal pharyngitis: Secondary | ICD-10-CM

## 2024-08-14 MED ORDER — AZITHROMYCIN 250 MG PO TABS
ORAL_TABLET | ORAL | 0 refills | Status: AC
Start: 2024-08-14 — End: 2024-08-19

## 2024-08-14 NOTE — Progress Notes (Signed)
 I have spent 5 minutes in review of e-visit questionnaire, review and updating patient chart, medical decision making and response to patient.   Claiborne Rigg, NP

## 2024-08-14 NOTE — Progress Notes (Signed)

## 2024-08-14 NOTE — Progress Notes (Signed)
 E-Visit for Sore Throat - Strep Symptoms  We are sorry that you are not feeling well.  Here is how we plan to help! Thank you for submitting the photos.   Based on what you have shared with me it is likely that you have strep pharyngitis.  Strep pharyngitis is inflammation and infection in the back of the throat.  This is an infection cause by bacteria and is treated with antibiotics.  I have prescribed Azithromycin  250 mg two tablets today and then one daily for 4 additional days. For throat pain, we recommend over the counter oral pain relief medications such as acetaminophen  or aspirin, or anti-inflammatory medications such as ibuprofen  or naproxen sodium. Topical treatments such as oral throat lozenges or sprays may be used as needed. Strep infections are not as easily transmitted as other respiratory infections, however we still recommend that you avoid close contact with loved ones, especially the very young and elderly.  Remember to wash your hands thoroughly throughout the day as this is the number one way to prevent the spread of infection and wipe down door knobs and counters with disinfectant.   Home Care: Only take medications as instructed by your medical team. Complete the entire course of an antibiotic. Do not take these medications with alcohol. A steam or ultrasonic humidifier can help congestion.  You can place a towel over your head and breathe in the steam from hot water coming from a faucet. Avoid close contacts especially the very young and the elderly. Cover your mouth when you cough or sneeze. Always remember to wash your hands.  Get Help Right Away If: You develop worsening fever or sinus pain. You develop a severe head ache or visual changes. Your symptoms persist after you have completed your treatment plan.  Make sure you Understand these instructions. Will watch your condition. Will get help right away if you are not doing well or get worse.   Thank you for  choosing an e-visit.  Your e-visit answers were reviewed by a board certified advanced clinical practitioner to complete your personal care plan. Depending upon the condition, your plan could have included both over the counter or prescription medications.  Please review your pharmacy choice. Make sure the pharmacy is open so you can pick up prescription now. If there is a problem, you may contact your provider through [employer] and have the prescription routed to another pharmacy.  Your safety is important to us . If you have drug allergies check your prescription carefully.   For the next 24 hours you can use MyChart to ask questions about today's visit, request a non-urgent call back, or ask for a work or school excuse. You will get an email in the next two days asking about your experience. I hope that your e-visit has been valuable and will speed your recovery.

## 2024-08-16 ENCOUNTER — Other Ambulatory Visit (HOSPITAL_BASED_OUTPATIENT_CLINIC_OR_DEPARTMENT_OTHER): Payer: Self-pay

## 2024-08-16 ENCOUNTER — Encounter (HOSPITAL_BASED_OUTPATIENT_CLINIC_OR_DEPARTMENT_OTHER): Payer: Self-pay

## 2024-08-16 ENCOUNTER — Emergency Department (HOSPITAL_BASED_OUTPATIENT_CLINIC_OR_DEPARTMENT_OTHER): Admission: EM | Admit: 2024-08-16 | Discharge: 2024-08-16 | Disposition: A | Discharge status: Home / Self Care

## 2024-08-16 ENCOUNTER — Other Ambulatory Visit: Payer: Self-pay

## 2024-08-16 ENCOUNTER — Emergency Department (HOSPITAL_BASED_OUTPATIENT_CLINIC_OR_DEPARTMENT_OTHER)

## 2024-08-16 DIAGNOSIS — J029 Acute pharyngitis, unspecified: Secondary | ICD-10-CM | POA: Diagnosis present

## 2024-08-16 LAB — CBC
HCT: 35.9 % — ABNORMAL LOW (ref 36.0–46.0)
Hemoglobin: 12.1 g/dL (ref 12.0–15.0)
MCH: 29.7 pg (ref 26.0–34.0)
MCHC: 33.7 g/dL (ref 30.0–36.0)
MCV: 88.2 fL (ref 80.0–100.0)
Platelets: 316 K/uL (ref 150–400)
RBC: 4.07 MIL/uL (ref 3.87–5.11)
RDW: 12.7 % (ref 11.5–15.5)
WBC: 12.1 K/uL — ABNORMAL HIGH (ref 4.0–10.5)
nRBC: 0 % (ref 0.0–0.2)

## 2024-08-16 LAB — RESP PANEL BY RT-PCR (RSV, FLU A&B, COVID)  RVPGX2
Influenza A by PCR: NEGATIVE
Influenza B by PCR: NEGATIVE
Resp Syncytial Virus by PCR: NEGATIVE
SARS Coronavirus 2 by RT PCR: NEGATIVE

## 2024-08-16 LAB — BASIC METABOLIC PANEL WITH GFR
Anion gap: 11 (ref 5–15)
BUN: 5 mg/dL — ABNORMAL LOW (ref 6–20)
CO2: 24 mmol/L (ref 22–32)
Calcium: 9.3 mg/dL (ref 8.9–10.3)
Chloride: 104 mmol/L (ref 98–111)
Creatinine, Ser: 0.52 mg/dL (ref 0.44–1.00)
GFR, Estimated: >60 mL/min (ref >60)
Glucose, Bld: 93 mg/dL (ref 70–99)
Potassium: 3.5 mmol/L (ref 3.5–5.1)
Sodium: 140 mmol/L (ref 135–145)

## 2024-08-16 LAB — GROUP A STREP BY PCR: Group A Strep by PCR: NOT DETECTED

## 2024-08-16 MED ORDER — IOHEXOL 300 MG/ML  SOLN
100.0000 mL | Freq: Once | INTRAMUSCULAR | Status: AC | PRN
  Administered 2024-08-16: 75 mL via INTRAVENOUS

## 2024-08-16 MED ORDER — DEXAMETHASONE 10 MG/ML FOR PEDIATRIC ORAL USE
10.0000 mg | Freq: Once | INTRAMUSCULAR | Status: AC
  Administered 2024-08-16: 10 mg via ORAL
  Filled 2024-08-16: qty 1

## 2024-08-16 MED ORDER — PHENOL 1.4 % MT LIQD
1.0000 | OROMUCOSAL | 0 refills | Status: DC | PRN
  Filled 2024-08-16: qty 177, 30d supply, fill #0

## 2024-08-16 MED ORDER — AMOXICILLIN 500 MG PO CAPS
1000.0000 mg | ORAL_CAPSULE | Freq: Every day | ORAL | 0 refills | Status: AC
Start: 2024-08-16 — End: 2024-08-23
  Filled 2024-08-16: qty 14, 7d supply, fill #0

## 2024-08-16 MED ORDER — DEXAMETHASONE 1 MG/ML PO CONC
10.0000 mg | Freq: Once | ORAL | Status: DC
Start: 2024-08-16 — End: 2024-08-16

## 2024-08-16 NOTE — ED Triage Notes (Addendum)
 Pt noticed swelling in left side lymph node last Wednesday. Now right is very swollen. Throat began hurting last Wednesday. Difficulty swallowing worse on right side. Pain radiates to right ear and across jaw Having to spit saliva out. Started  z pack on Saturday without relief. Chills and nausea Pt had tonsillar stone on right side but came out when swabbed

## 2024-08-16 NOTE — ED Provider Notes (Signed)
 [place] [facility]GENC[facility] DEPARTMENT AT [facility] Provider Note   CSN: [id number] Arrival date & time: 08/16/24  9188     Patient presents with: Sore Throat   Whitney Wilson is a 33 y.o. female.   Is a 33 year old female presents emergency department with sore throat x 5 days.  Progressively worsening.  Went to urgent care and was given Z-Pak has not had any improvement of symptoms.  Pain largely right-sided.  Painful and difficult swallowing currently.  No shortness of breath.  No chest pain.  No fevers.   Sore Throat       Prior to Admission medications   Medication Sig Start Date End Date Taking? Authorizing Provider  amoxicillin  (AMOXIL ) 500 MG capsule Take 2 capsules (1,000 mg total) by mouth daily for 7 days. 08/16/24 08/23/24 Yes Mercedes Fort J, DO  phenol (CHLORASEPTIC) 1.4 % LIQD Use as directed 1 spray in the mouth or throat as needed for throat irritation / pain. 08/16/24  Yes Neysa Caron PARAS, DO  azithromycin  (ZITHROMAX ) 250 MG tablet Take 2 tablets on day 1, then 1 tablet daily on days 2 through 5 08/14/24 08/19/24  Fleming, Zelda W, NP  ciprofloxacin -dexamethasone  (CIPRODEX ) OTIC suspension Apply 4 drops to the affected ear(s) twice daily x 7 days 06/10/24   Gladis Elsie BROCKS, PA-C  levonorgestrel (MIRENA, 52 MG,) 20 MCG/DAY IUD 1 each by Intrauterine route once.    [provider]  ZEPBOUND 2.5 MG/0.5ML Pen SMARTSIG:1 injection SUB-Q Once a Week    [provider]    Allergies: Amoxicillin , Morphine  and codeine, and Unasyn [ampicillin-sulbactam sodium]    Review of Systems  Updated Vital Signs BP (!) 124/94   Pulse 90   Temp 98.9 F (37.2 C)   Resp 18   Wt 86.2 kg   SpO2 98%   BMI 32.61 kg/m   Physical Exam Vitals and nursing note reviewed.  HENT:     Mouth/Throat:     Mouth: Mucous membranes are moist.     Pharynx: Uvula midline.     Tonsils: Tonsillar exudate present. 3+ on the right. 3+ on the left.  Cardiovascular:      Rate and Rhythm: Normal rate.     Heart sounds: Normal heart sounds.  Pulmonary:     Effort: Pulmonary effort is normal.  Abdominal:     Palpations: Abdomen is soft.  Musculoskeletal:     Cervical back: Neck supple.  Lymphadenopathy:     Cervical: Cervical adenopathy present.  Skin:    General: Skin is warm.     Capillary Refill: Capillary refill takes less than 2 seconds.  Neurological:     General: No focal deficit present.     Mental Status: She is alert.  Psychiatric:        Mood and Affect: Mood normal.        Behavior: Behavior normal.     (all labs ordered are listed, but only abnormal results are displayed) Labs Reviewed  CBC - Abnormal; Notable for the following components:      Result Value   WBC 12.1 (*)    HCT 35.9 (*)    All other components within normal limits  BASIC METABOLIC PANEL WITH GFR - Abnormal; Notable for the following components:   BUN 5 (*)    All other components within normal limits  GROUP A STREP BY PCR  RESP PANEL BY RT-PCR (RSV, FLU A&B, COVID)  [id number]    EKG: None  Radiology: CT  Soft Tissue Neck W Contrast Result Date: 08/16/2024 CLINICAL DATA:  Provided history: Soft tissue infection suspected, neck, x-ray done. EXAM: CT NECK WITH CONTRAST TECHNIQUE: Multidetector CT imaging of the neck was performed using the standard protocol following the bolus administration of intravenous contrast. RADIATION DOSE REDUCTION: This exam was performed according to the departmental dose-optimization program which includes automated exposure control, adjustment of the mA and/or kV according to patient size and/or use of iterative reconstruction technique. CONTRAST:  75mL OMNIPAQUE  IOHEXOL  300 MG/ML  SOLN COMPARISON:  None. FINDINGS: Pharynx and larynx: Prominence of the right palatine tonsil. No evidence of a tonsillar/peritonsillar abscess. No significant airway effacement. No appreciable swelling or mass elsewhere within the oral cavity, pharynx or larynx.  No retropharyngeal collection. Salivary glands: No inflammation, mass, or stone. Thyroid: Unremarkable. Lymph nodes: Bilateral cervical lymphadenopathy. An index right level 2 lymph node measures 2 cm in short axis (series 3, image 56) (series 7, image 31). Vascular: The major vascular structures of the neck are patent. Limited intracranial: No evidence of an acute intracranial abnormality within the field of view. Visualized orbits: Incompletely imaged. No orbital mass or acute orbital finding. Mastoids and visualized paranasal sinuses: The frontal sinuses are excluded from the field of view. Portions of the ethmoid and left sphenoid sinuses are also excluded from the field of view superiorly. Trace mucosal thickening within the left maxillary sinus. No significant mastoid effusion. Skeleton: Mild spondylosis at the cervical and visible upper thoracic levels. No acute fracture or aggressive osseous lesion. Upper chest: No consolidation within the imaged lung apices. IMPRESSION: 1. Prominent and edematous right palatine tonsil. This likely reflects acute tonsillitis/pharyngitis. However given tonsil asymmetry, close clinical follow-up is recommended (with imaging follow-up as warranted) to ensure resolution with treatment and to exclude a tonsillar neoplasm. No evidence of a tonsillar/peritonsillar abscess. No significant airway effacement. 2. Bilateral cervical lymphadenopathy. An index right level II lymph node measures 2 cm in short axis. Close clinical follow-up recommended (with imaging follow-up as warranted) to ensure resolution with treatment. 3. Minor left maxillary sinus mucosal thickening. Electronically Signed   By: Rockey Childs D.O.   On: 08/16/2024 10:59     Procedures   Medications Ordered in the ED  dexamethasone  (DECADRON ) 10 MG/ML injection for Pediatric ORAL use 10 mg (10 mg Oral Given 08/16/24 0926)  iohexol  (OMNIPAQUE ) 300 MG/ML solution 100 mL (75 mLs Intravenous Contrast Given 08/16/24  1004)    Clinical Course as of 08/16/24 1119  Mon Aug 16, 2024  0917 Group A Strep by PCR: NOT DETECTED [TY]  1046 CT Soft Tissue Neck W Contrast Airway appears patent on my independent review of images. [TY]  1106 CT Soft Tissue Neck W Contrast IMPRESSION: 1. Prominent and edematous right palatine tonsil. This likely reflects acute tonsillitis/pharyngitis. However given tonsil asymmetry, close clinical follow-up is recommended (with imaging follow-up as warranted) to ensure resolution with treatment and to exclude a tonsillar neoplasm. No evidence of a tonsillar/peritonsillar abscess. No significant airway effacement. 2. Bilateral cervical lymphadenopathy. An index right level II lymph node measures 2 cm in short axis. Close clinical follow-up recommended (with imaging follow-up as warranted) to ensure resolution with treatment. 3. Minor left maxillary sinus mucosal thickening.   Electronically Signed   By: Rockey Childs D.O.   On: 08/16/2024 10:59   [TY]  1113 Is feeling some improvement after Decadron .  With reassuring CT scan vitals and lab work that she is appropriate for discharge.  Will change antibiotic, she reports that  she is not allergic to penicillins and has tolerated amoxicillin  in the past without any ill effect.  Will give her the number to ENT given radiology's recommendations.  Stable for discharge at this time. [TY]    Clinical Course User Index [TY] Neysa Caron PARAS, DO                                 Medical Decision Making This is a 33 year old female presents emergency department with sore throat.  She is afebrile nontachycardic, normotensive.  She does not appear to be in distress, managing her secretions, reported difficulty opening her mouth this morning, but doing so without difficulty currently.  She does have some tonsillar swelling, but uvula does appear to be midline.  She is noting severe pain, strep test was negative, considered other viral etiology,  flu/COVID/RSV negative.  Given her severe pain we will proceed for CT scan to exclude deep space infection.  CT course for further MDM and disposition.  Amount and/or Complexity of Data Reviewed Labs: ordered. Decision-making details documented in ED Course. Radiology: ordered. Decision-making details documented in ED Course.  Risk OTC drugs. Prescription drug management.      Final diagnoses:  Pharyngitis, unspecified etiology    ED Discharge Orders          Ordered    amoxicillin  (AMOXIL ) 500 MG capsule  Daily        08/16/24 1116    phenol (CHLORASEPTIC) 1.4 % LIQD  As needed        08/16/24 1116               Neysa Caron PARAS, DO 08/16/24 1119

## 2024-08-16 NOTE — Discharge Instructions (Signed)
 May take Tylenol  turning with ibuprofen  for pain.  We are prescribing you antibiotic.  Please stop taking the azithromycin .  There is a chance that you could have a allergic reaction to this medication, stop taking if develop rash, tongue or lip swelling, difficulty breathing.

## 2025-01-20 ENCOUNTER — Telehealth: Admitting: Physician Assistant

## 2025-01-20 DIAGNOSIS — R3989 Other symptoms and signs involving the genitourinary system: Secondary | ICD-10-CM | POA: Diagnosis not present

## 2025-01-20 MED ORDER — NITROFURANTOIN MONOHYD MACRO 100 MG PO CAPS: 100.0000 mg | ORAL_CAPSULE | Freq: Two times a day (BID) | ORAL | 0 refills | Status: AC

## 2025-01-20 NOTE — Progress Notes (Signed)
